# Patient Record
Sex: Male | Born: 1965 | Race: Black or African American | Hispanic: No | Marital: Married | Smoking: Current some day smoker
Health system: Southern US, Community
[De-identification: ages and names within clinical notes are randomized; demographics above are authoritative.]

## PROBLEM LIST (undated history)

## (undated) DIAGNOSIS — I10 Essential (primary) hypertension: Secondary | ICD-10-CM

## (undated) DIAGNOSIS — R011 Cardiac murmur, unspecified: Secondary | ICD-10-CM

## (undated) DIAGNOSIS — B009 Herpesviral infection, unspecified: Secondary | ICD-10-CM

## (undated) DIAGNOSIS — E7800 Pure hypercholesterolemia, unspecified: Secondary | ICD-10-CM

## (undated) HISTORY — PX: EYE SURGERY: SHX253

## (undated) HISTORY — PX: LUMBAR EPIDURAL INJECTION: SHX1980

---

## 2013-02-04 ENCOUNTER — Emergency Department (HOSPITAL_COMMUNITY)
Admission: EM | Admit: 2013-02-04 | Discharge: 2013-02-05 | Disposition: A | Discharge status: Home / Self Care | Payer: BC Managed Care – PPO | Attending: Emergency Medicine | Admitting: Emergency Medicine

## 2013-02-04 ENCOUNTER — Encounter (HOSPITAL_COMMUNITY): Payer: Self-pay | Admitting: *Deleted

## 2013-02-04 DIAGNOSIS — R142 Eructation: Secondary | ICD-10-CM | POA: Insufficient documentation

## 2013-02-04 DIAGNOSIS — R143 Flatulence: Secondary | ICD-10-CM | POA: Insufficient documentation

## 2013-02-04 DIAGNOSIS — Z79899 Other long term (current) drug therapy: Secondary | ICD-10-CM | POA: Insufficient documentation

## 2013-02-04 DIAGNOSIS — N289 Disorder of kidney and ureter, unspecified: Secondary | ICD-10-CM

## 2013-02-04 DIAGNOSIS — R109 Unspecified abdominal pain: Secondary | ICD-10-CM | POA: Insufficient documentation

## 2013-02-04 DIAGNOSIS — E7800 Pure hypercholesterolemia, unspecified: Secondary | ICD-10-CM | POA: Insufficient documentation

## 2013-02-04 DIAGNOSIS — R141 Gas pain: Secondary | ICD-10-CM | POA: Insufficient documentation

## 2013-02-04 DIAGNOSIS — Z7982 Long term (current) use of aspirin: Secondary | ICD-10-CM | POA: Insufficient documentation

## 2013-02-04 DIAGNOSIS — I10 Essential (primary) hypertension: Secondary | ICD-10-CM | POA: Insufficient documentation

## 2013-02-04 HISTORY — DX: Essential (primary) hypertension: I10

## 2013-02-04 HISTORY — DX: Pure hypercholesterolemia, unspecified: E78.00

## 2013-02-04 NOTE — ED Notes (Signed)
Pt in c/o abd pain and bloating since this afternoon, states it started after he ate lunch today

## 2013-02-04 NOTE — ED Notes (Signed)
Pt states he ate lunch around 12:30 today (chicken, vegetables, and yams), about an hour after started having abdominal pain, stated "I felt like my stomach was in a ball". Pt states had 2 normal BM's after lunch today, denies nausea and vomiting, states has passed some gas, but states still having abdominal pain. States now the pain is radiating around to lower back.

## 2013-02-05 ENCOUNTER — Emergency Department (HOSPITAL_COMMUNITY): Payer: BC Managed Care – PPO

## 2013-02-05 LAB — COMPREHENSIVE METABOLIC PANEL
Albumin: 3.9 g/dL (ref 3.5–5.2)
Alkaline Phosphatase: 47 U/L (ref 39–117)
BUN: 25 mg/dL — ABNORMAL HIGH (ref 6–23)
Calcium: 9.3 mg/dL (ref 8.4–10.5)
Creatinine, Ser: 4.23 mg/dL — ABNORMAL HIGH (ref 0.50–1.35)
GFR calc Af Amer: 18 mL/min — ABNORMAL LOW (ref >90)
Glucose, Bld: 109 mg/dL — ABNORMAL HIGH (ref 70–99)
Total Protein: 7.2 g/dL (ref 6.0–8.3)

## 2013-02-05 LAB — URINALYSIS, ROUTINE W REFLEX MICROSCOPIC
Glucose, UA: NEGATIVE mg/dL
Ketones, ur: NEGATIVE mg/dL
Leukocytes, UA: NEGATIVE
Nitrite: NEGATIVE
Specific Gravity, Urine: 1.011 (ref 1.005–1.030)
pH: 5 (ref 5.0–8.0)

## 2013-02-05 LAB — CBC WITH DIFFERENTIAL/PLATELET
Basophils Relative: 0 % (ref 0–1)
Eosinophils Absolute: 0.1 10*3/uL (ref 0.0–0.7)
Eosinophils Relative: 1 % (ref 0–5)
Hemoglobin: 14.3 g/dL (ref 13.0–17.0)
Lymphs Abs: 1.4 10*3/uL (ref 0.7–4.0)
MCH: 30.3 pg (ref 26.0–34.0)
MCHC: 34.6 g/dL (ref 30.0–36.0)
MCV: 87.5 fL (ref 78.0–100.0)
Monocytes Absolute: 0.8 10*3/uL (ref 0.1–1.0)
Monocytes Relative: 8 % (ref 3–12)
Neutrophils Relative %: 76 % (ref 43–77)
RBC: 4.72 MIL/uL (ref 4.22–5.81)

## 2013-02-05 LAB — URINE MICROSCOPIC-ADD ON

## 2013-02-05 MED ORDER — HYDROCODONE-ACETAMINOPHEN 5-325 MG PO TABS: 1.0000 | ORAL_TABLET | Freq: Four times a day (QID) | ORAL | Status: DC | PRN

## 2013-02-05 MED ORDER — HYDROMORPHONE HCL PF 1 MG/ML IJ SOLN
1.0000 mg | Freq: Once | INTRAMUSCULAR | Status: AC
  Administered 2013-02-05: 1 mg via INTRAVENOUS
  Filled 2013-02-05: qty 1

## 2013-02-05 MED ORDER — SODIUM CHLORIDE 0.9 % IV SOLN
1000.0000 mL | Freq: Once | INTRAVENOUS | Status: AC
  Administered 2013-02-05: 1000 mL via INTRAVENOUS

## 2013-02-05 MED ORDER — SODIUM CHLORIDE 0.9 % IV SOLN
1000.0000 mL | INTRAVENOUS | Status: DC
  Administered 2013-02-05: 1000 mL via INTRAVENOUS

## 2013-02-05 MED ORDER — ONDANSETRON HCL 4 MG/2ML IJ SOLN
4.0000 mg | Freq: Once | INTRAMUSCULAR | Status: AC
Start: 2013-02-05 — End: 2013-02-05
  Administered 2013-02-05: 4 mg via INTRAVENOUS
  Filled 2013-02-05: qty 2

## 2013-02-05 NOTE — ED Provider Notes (Signed)
History     CSN: [id number]  Arrival date & time 02/04/13  2140   First MD Initiated Contact with Patient 02/05/13 0037      Chief Complaint  Patient presents with  . Abdominal Pain    (Consider location/radiation/quality/duration/timing/severity/associated sxs/prior treatment) HPI Comments: Patient states, that after he ate lunch today, he developed abdominal bloating, 2 bowel movements and bloating.  Has been persistent.  He denies nausea, vomiting, has not had any abdominal surgery.  Has not been any traumatic event, recently, the pain is now radiating to his back  Patient is a 47 y.o. male presenting with abdominal pain. The history is provided by the patient.  Abdominal Pain This is a new problem. The current episode started today. The problem occurs constantly. The problem has been gradually worsening. Associated symptoms include abdominal pain. Pertinent negatives include no coughing, fever, nausea, rash or vomiting. Nothing aggravates the symptoms. He has tried nothing for the symptoms. The treatment provided no relief.    Past Medical History  Diagnosis Date  . Hypertension   . High cholesterol     History reviewed. No pertinent past surgical history.  History reviewed. No pertinent family history.  History  Substance Use Topics  . Smoking status: Not on file  . Smokeless tobacco: Not on file  . Alcohol Use: Not on file      Review of Systems  Constitutional: Negative for fever.  Respiratory: Negative for cough.   Gastrointestinal: Positive for abdominal pain and abdominal distention. Negative for nausea, vomiting, diarrhea and constipation.  Genitourinary: Negative for dysuria and frequency.  Skin: Negative for rash and wound.  Neurological: Negative for dizziness.  All other systems reviewed and are negative.    Allergies  Review of patient's allergies indicates no known allergies.  Home Medications   Current Outpatient Rx  Name  Route  Sig   Dispense  Refill  . aspirin EC 81 MG tablet   Oral   Take 81 mg by mouth every Monday, Wednesday, and Friday.         Marland Kitchen atorvastatin (LIPITOR) 40 MG tablet   Oral   Take 40 mg by mouth daily.         Marland Kitchen losartan (COZAAR) 100 MG tablet   Oral   Take 100 mg by mouth daily.         . Multiple Vitamin (MULTIVITAMIN WITH MINERALS) TABS   Oral   Take 1 tablet by mouth daily.         . naproxen (NAPROSYN) 500 MG tablet   Oral   Take 500 mg by mouth 2 (two) times daily as needed (pain).         Marland Kitchen HYDROcodone-acetaminophen (NORCO/VICODIN) 5-325 MG per tablet   Oral   Take 1 tablet by mouth every 6 (six) hours as needed for pain.   12 tablet   0     BP 140/83  Pulse 53  Temp(Src) 97.8 F (36.6 C) (Oral)  Resp 18  Ht 5\' 9"  (1.753 m)  Wt 225 lb (102.059 kg)  BMI 33.21 kg/m2  SpO2 100%  Physical Exam  Vitals reviewed. Constitutional: He is oriented to person, place, and time. He appears well-developed and well-nourished. No distress.  HENT:  Head: Normocephalic and atraumatic.  Eyes: Pupils are equal, round, and reactive to light.  Neck: Normal range of motion.  Cardiovascular: Normal rate and regular rhythm.   Pulmonary/Chest: Effort normal.  Abdominal: Soft. He exhibits distension. There is no tenderness.  +  BS on the Left but decreased on the R   Musculoskeletal: Normal range of motion.  Neurological: He is alert and oriented to person, place, and time.  Skin: Skin is warm and dry.    ED Course  Procedures (including critical care time)  Labs Reviewed  COMPREHENSIVE METABOLIC PANEL - Abnormal; Notable for the following:    Glucose, Bld 109 (*)    BUN 25 (*)    Creatinine, Ser 4.23 (*)    AST 40 (*)    GFR calc non Af Amer 16 (*)    GFR calc Af Amer 18 (*)    All other components within normal limits  URINALYSIS, ROUTINE W REFLEX MICROSCOPIC - Abnormal; Notable for the following:    Hgb urine dipstick SMALL (*)    Protein, ur 30 (*)    All other  components within normal limits  CBC WITH DIFFERENTIAL  URINE MICROSCOPIC-ADD ON   US Renal  02/05/2013   *RADIOLOGY REPORT*  Clinical Data: Abdominal pain.  RENAL/URINARY TRACT ULTRASOUND COMPLETE  Comparison:  No priors.  Findings:  Right Kidney:  Parenchyma is slightly increased in echogenicity, which suggests underlying medical renal disease.  At the junction of the upper and interpolar region there is a 2.2 x 1.6 x 2.0 cm anechoic lesion with increased through transmission, compatible with a cyst.  No hydronephrosis.  10.9 cm in length.  Left Kidney:  Diffusely echogenic renal parenchyma, suggestive of medical renal disease.  No focal cystic or solid renal lesions.  No hydronephrosis.  11.8 cm in length.  Bladder:  Only mildly distended.  No focal wall abnormalities. Bilateral ureteral jets are noted.  IMPRESSION: 1.  Echogenic renal parenchyma bilaterally, suggestive of medical renal disease. 2.  2.2 x 1.6 x 2.0 cm cyst in the right kidney.   Original Report Authenticated By: Trudie Reed, M.D.   Dg Abd Acute W/chest  02/05/2013   *RADIOLOGY REPORT*  Clinical Data: Abdominal pain and nausea.  ACUTE ABDOMEN SERIES (ABDOMEN 2 VIEW & CHEST 1 VIEW)  Comparison: No priors.  Findings: Lung volumes are low.  No acute consolidative airspace disease.  No pleural effusions.  Pulmonary vasculature and the cardiomediastinal silhouette are within normal limits.  Gas and stool are noted throughout the colon extending to the level of the distal rectum.  No pathologic dilatation of small bowel. Several nondilated gas-filled loops of small bowel are noted in the central abdomen.  No pneumoperitoneum.  IMPRESSION: 1.  Nonspecific, nonobstructive bowel gas pattern. 2.  No pneumoperitoneum. 3.  Low lung volumes without radiographic evidence of acute cardiopulmonary disease.   Original Report Authenticated By: Trudie Reed, M.D.     1. Acute renal insufficiency   2. Abdominal pain       MDM   Discuss the  findings of the ultrasound with patient and his wife.  His lab values were reviewed.  They understand the importance of following up with his primary care physician today.  he's been cautioned about using nonsteroidal medications        Arman Filter, NP 02/05/13 [phone]

## 2013-02-06 NOTE — ED Provider Notes (Signed)
Medical screening examination/treatment/procedure(s) were performed by non-physician practitioner and as supervising physician I was immediately available for consultation/collaboration.  Courage Biglow, MD 02/06/13 0532 

## 2013-08-25 ENCOUNTER — Ambulatory Visit (HOSPITAL_COMMUNITY): Payer: BC Managed Care – PPO | Attending: Cardiology | Admitting: Radiology

## 2013-08-25 ENCOUNTER — Encounter: Payer: Self-pay | Admitting: Cardiology

## 2013-08-25 ENCOUNTER — Other Ambulatory Visit (HOSPITAL_COMMUNITY): Payer: Self-pay | Admitting: Family Medicine

## 2013-08-25 DIAGNOSIS — I059 Rheumatic mitral valve disease, unspecified: Secondary | ICD-10-CM

## 2013-08-25 DIAGNOSIS — I379 Nonrheumatic pulmonary valve disorder, unspecified: Secondary | ICD-10-CM | POA: Insufficient documentation

## 2013-08-25 DIAGNOSIS — I10 Essential (primary) hypertension: Secondary | ICD-10-CM | POA: Insufficient documentation

## 2013-08-25 DIAGNOSIS — E785 Hyperlipidemia, unspecified: Secondary | ICD-10-CM | POA: Insufficient documentation

## 2013-08-25 DIAGNOSIS — I359 Nonrheumatic aortic valve disorder, unspecified: Secondary | ICD-10-CM | POA: Insufficient documentation

## 2013-08-25 DIAGNOSIS — I351 Nonrheumatic aortic (valve) insufficiency: Secondary | ICD-10-CM

## 2013-08-25 NOTE — Progress Notes (Signed)
Echocardiogram performed.  

## 2013-12-14 ENCOUNTER — Encounter: Payer: Self-pay | Admitting: *Deleted

## 2014-07-20 ENCOUNTER — Ambulatory Visit (INDEPENDENT_AMBULATORY_CARE_PROVIDER_SITE_OTHER): Payer: BC Managed Care – PPO

## 2014-07-20 ENCOUNTER — Ambulatory Visit (INDEPENDENT_AMBULATORY_CARE_PROVIDER_SITE_OTHER): Payer: BC Managed Care – PPO | Admitting: Podiatry

## 2014-07-20 ENCOUNTER — Encounter: Payer: Self-pay | Admitting: Podiatry

## 2014-07-20 VITALS — BP 139/84 | HR 68 | Resp 16

## 2014-07-20 DIAGNOSIS — M79673 Pain in unspecified foot: Secondary | ICD-10-CM

## 2014-07-20 DIAGNOSIS — M2010 Hallux valgus (acquired), unspecified foot: Secondary | ICD-10-CM

## 2014-07-20 DIAGNOSIS — M898X9 Other specified disorders of bone, unspecified site: Secondary | ICD-10-CM

## 2014-07-20 DIAGNOSIS — L600 Ingrowing nail: Secondary | ICD-10-CM

## 2014-07-20 NOTE — Progress Notes (Signed)
   Subjective:    Patient ID: Greg HeritageReginald Ertl, male    DOB: 02/09/1966, 48 y.o.   MRN: [id number]  HPI Comments: "I have an ingrown"  Patient c/o tender 1st toe right, lateral border, for few weeks. He's been trimming down. No help.     Review of Systems  All other systems reviewed and are negative.      Objective:   Physical Exam        Assessment & Plan:

## 2014-07-20 NOTE — Patient Instructions (Addendum)
Betadine Soak Instructions  Purchase an 8 oz. bottle of BETADINE solution (Povidone)  THE DAY AFTER THE PROCEDURE  Place 1 tablespoon of betadine solution in a quart of warm tap water.  Submerge your foot or feet with outer bandage intact for the initial soak; this will allow the bandage to become moist and wet for easy lift off.  Once you remove your bandage, continue to soak in the solution for 20 minutes.  This soak should be done twice a day.  Next, remove your foot or feet from solution, blot dry the affected area and cover.  You may use a band aid large enough to cover the area or use gauze and tape.  Apply other medications to the area as directed by the doctor such as cortisporin otic solution (ear drops) or neosporin.  IF YOUR SKIN BECOMES IRRITATED WHILE USING THESE INSTRUCTIONS, IT IS OKAY TO SWITCH TO EPSOM SALTS AND WATER OR WHITE VINEGAR AND WATER.   Long Term Care Instructions-Post Nail Surgery  You have had your ingrown toenail and root treated with a chemical.  This chemical causes a burn that will drain and ooze like a blister.  This can drain for 6-8 weeks or longer.  It is important to keep this area clean, covered, and follow the soaking instructions dispensed at the time of your surgery.  This area will eventually dry and form a scab.  Once the scab forms you no longer need to soak or apply a dressing.  If at any time you experience an increase in pain, redness, swelling, or drainage, you should contact the office as soon as possible.    Bunion (Hallux Valgus) A bony bump (protrusion) on the inside of the foot, at the base of the first toe, is called a bunion (hallux valgus). A bunion causes the first toe to angle toward the other toes. SYMPTOMS   A bony bump on the inside of the foot, causing an outward turning of the first toe. It may also overlap the second toe.  Thickening of the skin (callus) over the bony bump.  Fluid buildup under the callus. Fluid may become  red, tender, and swollen (inflamed) with constant irritation or pressure.  Foot pain and stiffness. CAUSES  Many causes exist, including:  Inherited from your family (genetics).  Injury (trauma) forcing the first toe into a position in which it overlaps other toes.  Bunions are also associated with wearing shoes that have a narrow toe box (pointy shoes). RISK INCREASES WITH:  Family history of foot abnormalities, especially bunions.  Arthritis.  Narrow shoes, especially high heels. PREVENTION  Wear shoes with a wide toe box.  Avoid shoes with high heels.  Wear a small pad between the big toe and second toe.  Maintain proper conditioning:  Foot and ankle flexibility.  Muscle strength and endurance. PROGNOSIS  With proper treatment, bunions can typically be cured. Occasionally, surgery is required.  RELATED COMPLICATIONS   Infection of the bunion.  Arthritis of the first toe.  Risks of surgery, including infection, bleeding, injury to nerves (numb toe), recurrent bunion, overcorrection (toe points inward), arthritis of the big toe, big toe pointing upward, and bone not healing. TREATMENT  Treatment first consists of stopping the activities that aggravate the pain, taking pain medicines, and icing to reduce inflammation and pain. Wear shoes with a wide toe box. Shoes can be modified by a shoe repair person to relieve pressure on the bunion, especially if you cannot find shoes with a  wide enough toe box. You may also place a pad with the center cut out in your shoe, to reduce pressure on the bunion. Sometimes, an arch support (orthotic) may reduce pressure on the bunion and alleviate the symptoms. Stretching and strengthening exercises for the muscles of the foot may be useful. You may choose to wear a brace or pad at night to hold the big toe away from the second toe. If non-surgical treatments are not successful, surgery may be needed. Surgery involves removing the overgrown  tissue and correcting the position of the first toe, by realigning the bones. Bunion surgery is typically performed on an outpatient basis, meaning you can go home the same day as surgery. The surgery may involve cutting the mid portion of the bone of the first toe, or just cutting and repairing (reconstructing) the ligaments and soft tissues around the first toe.  MEDICATION   If pain medicine is needed, nonsteroidal anti-inflammatory medicines, such as aspirin and ibuprofen, or other minor pain relievers, such as acetaminophen, are often recommended.  Do not take pain medicine for 7 days before surgery.  Prescription pain relievers are usually only prescribed after surgery. Use only as directed and only as much as you need.  Ointments applied to the skin may be helpful. HEAT AND COLD  Cold treatment (icing) relieves pain and reduces inflammation. Cold treatment should be applied for 10 to 15 minutes every 2 to 3 hours for inflammation and pain and immediately after any activity that aggravates your symptoms. Use ice packs or an ice massage.  Heat treatment may be used prior to performing the stretching and strengthening activities prescribed by your caregiver, physical therapist, or athletic trainer. Use a heat pack or a warm soak. SEEK MEDICAL CARE IF:   Symptoms get worse or do not improve in 2 weeks, despite treatment.  After surgery, you develop fever, increasing pain, redness, swelling, drainage of fluids, bleeding, or increasing warmth around the surgical area.  New, unexplained symptoms develop. (Drugs used in treatment may produce side effects.) Document Released: 08/21/2005 Document Revised: 11/13/2011 Document Reviewed: 12/03/2008 [facility] Patient Information 2015 [place], [place]. This information is not intended to replace advice given to you by your health care provider. Make sure you discuss any questions you have with your health care provider.

## 2014-07-20 NOTE — Progress Notes (Signed)
Subjective:     Patient ID: Greg Dillon, male   DOB: 12/13/1965, 48 y.o.   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 States it's been hurting for at least 5 weeks and getting gradually worse over time   Review of Systems  All other systems reviewed and are negative.      Objective:   Physical Exam  Constitutional: He is oriented to person, place, and time.  Cardiovascular: Intact distal pulses.   Musculoskeletal: Normal range of motion.  Neurological: He is oriented to person, place, and time.  Skin: Skin is warm.  Nursing note and vitals reviewed. neurovascular status intact with muscle strength adequate and range of motion subtalar and midtarsal joint within normal limits. Patient is noted to have significant for foot structural malalignment with large hyperostosis medial aspect first metatarsal head and deviation of the hallux against second toe with irritated area and incurvated nail border right hallux lateral border that's painful when pressed and also some hypertrophic tissue secondary to the pressure of the big toe against the second toe. 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 This would require a nonweightbearing procedure to fix and at this time her to focus on the nail and see if we can get him better. Today I went ahead and discussed nail correction the fact it may recur and ultimately he may require surgery of his structural deformity but work and affixed at this way and I  explained to him risk. Patient wants surgery and today I infiltrated 60 mg Xylocaine Marcaine mixture removed the lateral border exposed matrix and applied phenol 3 applications 30 seconds followed by alcohol lavaged and sterile dressing. Gave instructions on soaks and reappoint to discuss further deformity

## 2014-07-22 ENCOUNTER — Ambulatory Visit (INDEPENDENT_AMBULATORY_CARE_PROVIDER_SITE_OTHER): Payer: BC Managed Care – PPO | Admitting: Podiatry

## 2014-07-22 ENCOUNTER — Telehealth: Payer: Self-pay | Admitting: *Deleted

## 2014-07-22 DIAGNOSIS — L03031 Cellulitis of right toe: Secondary | ICD-10-CM

## 2014-07-22 NOTE — Telephone Encounter (Signed)
Pt states he had a toenail procedure on 07/20/2014, and today it is very swollen.  I called the pt and he stated he called again, was instructed to come to the office.

## 2014-07-23 NOTE — Progress Notes (Signed)
Subjective:     Patient ID: Greg HeritageReginald Whitaker, male   DOB: 12/21/1965, 48 y.o.   MRN: [id number]  HPI patient states just wanted to get my toe checked   Review of Systems     Objective:   Physical Exam Neurovascular status intact with well-healing nail site with no indications of disease    Assessment:     Well healing paronychia site    Plan:     Instructed on physical therapy and padding and if symptoms were to persist or drainage were to start patient's to reappoint

## 2014-08-19 ENCOUNTER — Telehealth: Payer: Self-pay | Admitting: *Deleted

## 2014-08-19 NOTE — Telephone Encounter (Signed)
Patient called stating that he had ingrown procedure done 1 month ago and is still having discomfort and numbness.

## 2014-08-26 ENCOUNTER — Other Ambulatory Visit (HOSPITAL_COMMUNITY): Payer: Self-pay | Admitting: Family Medicine

## 2014-08-26 ENCOUNTER — Encounter: Payer: Self-pay | Admitting: Podiatry

## 2014-08-26 ENCOUNTER — Ambulatory Visit (HOSPITAL_COMMUNITY): Payer: BC Managed Care – PPO | Attending: Family Medicine | Admitting: Radiology

## 2014-08-26 ENCOUNTER — Ambulatory Visit (INDEPENDENT_AMBULATORY_CARE_PROVIDER_SITE_OTHER): Payer: BC Managed Care – PPO | Admitting: Podiatry

## 2014-08-26 ENCOUNTER — Ambulatory Visit (INDEPENDENT_AMBULATORY_CARE_PROVIDER_SITE_OTHER): Payer: BC Managed Care – PPO

## 2014-08-26 VITALS — BP 139/84 | HR 68 | Resp 16

## 2014-08-26 DIAGNOSIS — M79676 Pain in unspecified toe(s): Secondary | ICD-10-CM

## 2014-08-26 DIAGNOSIS — M2010 Hallux valgus (acquired), unspecified foot: Secondary | ICD-10-CM

## 2014-08-26 DIAGNOSIS — I351 Nonrheumatic aortic (valve) insufficiency: Secondary | ICD-10-CM

## 2014-08-26 NOTE — Progress Notes (Signed)
Subjective:     Patient ID: Greg Dillon, male   DOB: 10/08/1965, 48 y.o.   MRN: [id number]  HPI patient states I'm having some trouble with the right big toe I just wanted to make sure it was okay   Review of Systems     Objective:   Physical Exam Approximate 4 weeks after having ingrown toenail removal right lateral border with a postop probable irritation from Betadine creating blister with good healing occurring now crust on the lateral surface and mild localized drainage with no proximal edema erythema or drainage noted    Assessment:     Mild localized paronychia infection with no apparent proximal spread    Plan:     As a precautionary measure I did x-ray to rule out any bony issues and I then evaluated and explained the utilization of soaks dressings and that it should heal uneventfully. If anything should occur or other symptoms she is to let us know he also complained about mild numbness in the toe and I do think as the blister has healed this will gradually get better over time but he will come to us if that gives him any issue

## 2014-08-26 NOTE — Progress Notes (Signed)
Echocardiogram performed.  

## 2015-05-19 ENCOUNTER — Encounter (HOSPITAL_COMMUNITY): Payer: Self-pay | Admitting: Emergency Medicine

## 2015-05-19 ENCOUNTER — Emergency Department (HOSPITAL_COMMUNITY)
Admission: EM | Admit: 2015-05-19 | Discharge: 2015-05-19 | Disposition: A | Discharge status: Home / Self Care | Payer: BLUE CROSS/BLUE SHIELD | Attending: Emergency Medicine | Admitting: Emergency Medicine

## 2015-05-19 ENCOUNTER — Emergency Department (HOSPITAL_COMMUNITY): Payer: BLUE CROSS/BLUE SHIELD

## 2015-05-19 DIAGNOSIS — Z7982 Long term (current) use of aspirin: Secondary | ICD-10-CM | POA: Insufficient documentation

## 2015-05-19 DIAGNOSIS — Z79899 Other long term (current) drug therapy: Secondary | ICD-10-CM | POA: Diagnosis not present

## 2015-05-19 DIAGNOSIS — R011 Cardiac murmur, unspecified: Secondary | ICD-10-CM | POA: Insufficient documentation

## 2015-05-19 DIAGNOSIS — E780 Pure hypercholesterolemia: Secondary | ICD-10-CM | POA: Insufficient documentation

## 2015-05-19 DIAGNOSIS — R079 Chest pain, unspecified: Secondary | ICD-10-CM | POA: Insufficient documentation

## 2015-05-19 DIAGNOSIS — I10 Essential (primary) hypertension: Secondary | ICD-10-CM | POA: Insufficient documentation

## 2015-05-19 HISTORY — DX: Cardiac murmur, unspecified: R01.1

## 2015-05-19 LAB — BASIC METABOLIC PANEL
ANION GAP: 7 (ref 5–15)
BUN: 12 mg/dL (ref 6–20)
CALCIUM: 9.2 mg/dL (ref 8.9–10.3)
CHLORIDE: 105 mmol/L (ref 101–111)
CO2: 28 mmol/L (ref 22–32)
Creatinine, Ser: 1.33 mg/dL — ABNORMAL HIGH (ref 0.61–1.24)
GFR calc Af Amer: >60 mL/min (ref >60)
GFR calc non Af Amer: >60 mL/min (ref >60)
GLUCOSE: 133 mg/dL — AB (ref 65–99)
Potassium: 3.8 mmol/L (ref 3.5–5.1)
Sodium: 140 mmol/L (ref 135–145)

## 2015-05-19 LAB — CBC
HCT: 44.3 % (ref 39.0–52.0)
HEMOGLOBIN: 15.1 g/dL (ref 13.0–17.0)
MCH: 30.6 pg (ref 26.0–34.0)
MCHC: 34.1 g/dL (ref 30.0–36.0)
MCV: 89.7 fL (ref 78.0–100.0)
Platelets: 235 10*3/uL (ref 150–400)
RBC: 4.94 MIL/uL (ref 4.22–5.81)
RDW: 12.9 % (ref 11.5–15.5)
WBC: 4.7 10*3/uL (ref 4.0–10.5)

## 2015-05-19 LAB — I-STAT TROPONIN, ED
TROPONIN I, POC: 0 ng/mL (ref 0.00–0.08)
Troponin i, poc: 0 ng/mL (ref 0.00–0.08)

## 2015-05-19 MED ORDER — ASPIRIN 81 MG PO CHEW
324.0000 mg | CHEWABLE_TABLET | Freq: Once | ORAL | Status: AC
  Administered 2015-05-19: 324 mg via ORAL
  Filled 2015-05-19: qty 4

## 2015-05-19 NOTE — ED Notes (Signed)
Pt returned from xray

## 2015-05-19 NOTE — ED Provider Notes (Signed)
CSN: [id number]     Arrival date & time 05/19/15  0704 History   First MD Initiated Contact with Patient 05/19/15 [phone]     Chief Complaint  Patient presents with  . Chest Pain     (Consider location/radiation/quality/duration/timing/severity/associated sxs/prior Treatment) HPI Comments: 49 year old male with a history of hypertension, hyperlipidemia who presents with chest pain. The patient states that 2-3 days ago, he had a gradual onset of intermittent, mild, left-sided chest pain that starts in his left breast and radiates to his left axilla. The pain was mild yesterday but since he is woken up this morning today, the pain has been constant rather than intermittent which is what prompted him to come in today. He denies any associated shortness of breath, nausea/vomiting, or diaphoresis. The pain is not associated with activity. The chest pain does not worsen with movement or laying flat. He ate dinner last night and felt like the chest pain mildly improved after dinner. He denies any feelings of heartburn. No recent cough/cold symptoms, fevers, vomiting, diarrhea. No recent travel. No leg pain or swelling.  Family history negative for cardiac disease or blood clots.  Patient is a 49 y.o. male presenting with chest pain. The history is provided by the patient.  Chest Pain   Past Medical History  Diagnosis Date  . Hypertension   . High cholesterol   . Heart murmur    History reviewed. No pertinent past surgical history. Family History  Problem Relation Age of Onset  . Hypertension Mother   . Hyperlipidemia Father   . Diabetes Mellitus I Father   . Hypertension Sister   . Seizures Brother    Social History  Substance Use Topics  . Smoking status: Never Smoker   . Smokeless tobacco: None  . Alcohol Use: No    Review of Systems  Cardiovascular: Positive for chest pain.   10 Systems reviewed and are negative for acute change except as noted in the HPI.    Allergies   Losartan and Povidone iodine  Home Medications   Prior to Admission medications   Medication Sig Start Date End Date Taking? Authorizing Provider  amLODipine (NORVASC) 10 MG tablet Take 10 mg by mouth daily.   Yes Historical Provider, MD  atorvastatin (LIPITOR) 40 MG tablet Take 40 mg by mouth daily.   Yes Historical Provider, MD  aspirin EC 81 MG tablet Take 81 mg by mouth every Monday, Wednesday, and Friday.    Historical Provider, MD  Multiple Vitamin (MULTIVITAMIN WITH MINERALS) TABS Take 1 tablet by mouth daily.    Historical Provider, MD   BP 117/69 mmHg  Pulse 52  Temp(Src) 97.7 F (36.5 C) (Oral)  Resp 15  Ht  (1.753 m)  Wt 220 lb (99.791 kg)  BMI 32.47 kg/m2  SpO2 96% Physical Exam  Constitutional: He is oriented to person, place, and time. He appears well-developed and well-nourished. No distress.  HENT:  Head: Normocephalic and atraumatic.  Moist mucous membranes  Eyes: Conjunctivae are normal. Pupils are equal, round, and reactive to light.  Neck: Neck supple.  Cardiovascular: Normal rate, regular rhythm and normal heart sounds.   No murmur heard. Pulmonary/Chest: Effort normal and breath sounds normal.  Abdominal: Soft. Bowel sounds are normal. He exhibits no distension. There is no tenderness.  Musculoskeletal: He exhibits no edema.  Neurological: He is alert and oriented to person, place, and time.  Fluent speech  Skin: Skin is warm and dry.  Psychiatric: He has a normal mood  and affect. Judgment normal.  pleasant  Nursing note and vitals reviewed.   ED Course  Procedures (including critical care time) Labs Review Labs Reviewed  BASIC METABOLIC PANEL - Abnormal; Notable for the following:    Glucose, Bld 133 (*)    Creatinine, Ser 1.33 (*)    All other components within normal limits  CBC  I-STAT TROPOININ, ED  Rosezena Sensor, ED    Imaging Review Dg Chest 2 View  05/19/2015   CLINICAL DATA:  Left-sided chest pain  EXAM: CHEST  2 VIEW   COMPARISON:  February 05, 2013  FINDINGS: Lungs are clear. Heart size and pulmonary vascularity are normal. No adenopathy. No pneumothorax. No bone lesions.  IMPRESSION: No abnormality noted.   Electronically Signed   By: Bretta Bang III M.D.   On: 05/19/2015 07:45   I have personally reviewed and evaluated these images and lab results as part of my medical decision-making.   EKG Interpretation   Date/Time:  Wednesday May 19 2015 07:14:39 EDT Ventricular Rate:  65 PR Interval:  161 QRS Duration: 88 QT Interval:  404 QTC Calculation: 420 R Axis:   -31 Text Interpretation:  Sinus rhythm Abnormal R-wave progression, late  transition Left ventricular hypertrophy abnormal R wave through precordial  leads Confirmed by LITTLE MD, RACHEL ([id number]) on 05/19/2015 7:32:29 AM     Medications  aspirin chewable tablet 324 mg (324 mg Oral Given 05/19/15 0742)    MDM   Final diagnoses:  Chest pain, unspecified chest pain type   48yo M w/ HTN and HLD who p/w a few days of chest pain without any associated symptoms. Pt well appearing with reassuring VS at presentation. EKG without acute ischemic changes. Obtained labs including serial troponins as well as CXR. Gave pt 324mg  ASA.  The patient has no risk factors for PE and is PERC negative thus I feel PE is very unlikely. No sudden ripping/tearing CP and no widened mediastinum to suggest aortic dissection. Labs show creatinine of 1.33 but otherwise unremarkable including negative serial troponins. Based on patient's reassuring labs and EKG, I feel that ACS is very unlikely. Patient's HEART score is <3 given age and no smoking, no FH of heart disease. I feel that he is safe for discharge home with close follow-up. The patient already has established care with a cardiologist and I have encouraged him to schedule a follow-up appointment at the cardiology clinic. Return precautions reviewed and patient voiced understanding. Patient discharged in  satisfactory condition.    Laurence Spates, MD 05/19/15 [phone]

## 2015-05-19 NOTE — ED Notes (Signed)
Patient brought back to room; patient undressed, in gown, on monitor, continuous pulse oximetry and blood pressure cuff 

## 2015-05-19 NOTE — ED Notes (Signed)
Patient with chest pain that started almost 3 days ago.  Patient states that it is his left breast area and left axilla.  No shortness of breath.  Patient states that the pain increased throughout the day yesterday.  Patient does have high cholesterol and HTN and heart murmur and was worried.  He states that he felt the pain when he first woke up this morning.

## 2015-05-19 NOTE — Discharge Instructions (Signed)

## 2015-08-18 ENCOUNTER — Telehealth: Payer: Self-pay

## 2015-08-18 NOTE — Telephone Encounter (Signed)
NOTES ON FILE 

## 2015-09-03 ENCOUNTER — Ambulatory Visit (HOSPITAL_COMMUNITY): Payer: BLUE CROSS/BLUE SHIELD

## 2015-09-09 ENCOUNTER — Other Ambulatory Visit: Payer: Self-pay | Admitting: Family Medicine

## 2015-09-09 ENCOUNTER — Other Ambulatory Visit: Payer: Self-pay

## 2015-09-09 ENCOUNTER — Ambulatory Visit (HOSPITAL_COMMUNITY): Payer: BLUE CROSS/BLUE SHIELD | Attending: Cardiovascular Disease

## 2015-09-09 DIAGNOSIS — I10 Essential (primary) hypertension: Secondary | ICD-10-CM | POA: Insufficient documentation

## 2015-09-09 DIAGNOSIS — R011 Cardiac murmur, unspecified: Secondary | ICD-10-CM | POA: Insufficient documentation

## 2015-09-09 DIAGNOSIS — E785 Hyperlipidemia, unspecified: Secondary | ICD-10-CM | POA: Insufficient documentation

## 2015-09-09 DIAGNOSIS — I351 Nonrheumatic aortic (valve) insufficiency: Secondary | ICD-10-CM | POA: Diagnosis not present

## 2016-01-19 ENCOUNTER — Emergency Department (HOSPITAL_COMMUNITY)
Admission: EM | Admit: 2016-01-19 | Discharge: 2016-01-19 | Disposition: A | Discharge status: Home / Self Care | Payer: BLUE CROSS/BLUE SHIELD | Attending: Emergency Medicine | Admitting: Emergency Medicine

## 2016-01-19 ENCOUNTER — Emergency Department (HOSPITAL_COMMUNITY): Payer: BLUE CROSS/BLUE SHIELD

## 2016-01-19 ENCOUNTER — Encounter (HOSPITAL_COMMUNITY): Payer: Self-pay | Admitting: *Deleted

## 2016-01-19 DIAGNOSIS — S3992XA Unspecified injury of lower back, initial encounter: Secondary | ICD-10-CM | POA: Diagnosis not present

## 2016-01-19 DIAGNOSIS — Y999 Unspecified external cause status: Secondary | ICD-10-CM | POA: Diagnosis not present

## 2016-01-19 DIAGNOSIS — Y92096 Garden or yard of other non-institutional residence as the place of occurrence of the external cause: Secondary | ICD-10-CM | POA: Diagnosis not present

## 2016-01-19 DIAGNOSIS — M545 Low back pain, unspecified: Secondary | ICD-10-CM

## 2016-01-19 DIAGNOSIS — R52 Pain, unspecified: Secondary | ICD-10-CM | POA: Diagnosis not present

## 2016-01-19 DIAGNOSIS — Y93H2 Activity, gardening and landscaping: Secondary | ICD-10-CM | POA: Insufficient documentation

## 2016-01-19 DIAGNOSIS — W1830XA Fall on same level, unspecified, initial encounter: Secondary | ICD-10-CM | POA: Insufficient documentation

## 2016-01-19 DIAGNOSIS — M542 Cervicalgia: Secondary | ICD-10-CM | POA: Diagnosis not present

## 2016-01-19 DIAGNOSIS — I10 Essential (primary) hypertension: Secondary | ICD-10-CM | POA: Diagnosis not present

## 2016-01-19 DIAGNOSIS — G8929 Other chronic pain: Secondary | ICD-10-CM | POA: Diagnosis not present

## 2016-01-19 DIAGNOSIS — E7800 Pure hypercholesterolemia, unspecified: Secondary | ICD-10-CM | POA: Insufficient documentation

## 2016-01-19 DIAGNOSIS — Z79899 Other long term (current) drug therapy: Secondary | ICD-10-CM | POA: Diagnosis not present

## 2016-01-19 MED ORDER — HYDROMORPHONE HCL 1 MG/ML IJ SOLN
1.0000 mg | Freq: Once | INTRAMUSCULAR | Status: AC
  Administered 2016-01-19: 1 mg via INTRAMUSCULAR

## 2016-01-19 MED ORDER — HYDROMORPHONE HCL 1 MG/ML IJ SOLN
1.0000 mg | Freq: Once | INTRAMUSCULAR | Status: DC
  Filled 2016-01-19: qty 1

## 2016-01-19 MED ORDER — OXYCODONE-ACETAMINOPHEN 5-325 MG PO TABS: 2.0000 | ORAL_TABLET | ORAL | Status: DC | PRN

## 2016-01-19 MED ORDER — DIAZEPAM 5 MG PO TABS: 5.0000 mg | ORAL_TABLET | Freq: Two times a day (BID) | ORAL | Status: DC

## 2016-01-19 MED ORDER — KETOROLAC TROMETHAMINE 60 MG/2ML IM SOLN
60.0000 mg | Freq: Once | INTRAMUSCULAR | Status: AC
  Administered 2016-01-19: 60 mg via INTRAMUSCULAR
  Filled 2016-01-19: qty 2

## 2016-01-19 MED ORDER — DIAZEPAM 5 MG PO TABS
5.0000 mg | ORAL_TABLET | Freq: Once | ORAL | Status: AC
  Administered 2016-01-19: 5 mg via ORAL
  Filled 2016-01-19: qty 1

## 2016-01-19 MED ORDER — IBUPROFEN 200 MG PO TABS: 600.0000 mg | ORAL_TABLET | Freq: Once | ORAL | Status: DC

## 2016-01-19 MED ORDER — OXYCODONE-ACETAMINOPHEN 5-325 MG PO TABS: 1.0000 | ORAL_TABLET | Freq: Once | ORAL | Status: DC

## 2016-01-19 NOTE — ED Provider Notes (Signed)
CSN: [id number]     Arrival date & time 01/19/16  0857 History   First MD Initiated Contact with Patient 01/19/16 0914     Chief Complaint  Patient presents with  . Back Pain   HPI Comments: 50 year old male who presents with low back pain s/p fall earlier this morning. PMH significant for recurrent low back pain and spasms. He states he was doing yard work and felt his back start to spasm. He fell to the ground and reports immediate onset of pain. Denies fever, LOC, head injury, loss of bowel/bladder function, urinary retention, inability to walk or weakness in his legs, numbness/tingling. EMS was called who gave him of Fentanyl and Zofran en route. He has a history of back pain and has been evaluated by ortho in the past. 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 He has had steroid injections in his back but had never required surgery.  Patient is a 50 y.o. male presenting with back pain.  Back Pain Associated symptoms: no fever     Past Medical History  Diagnosis Date  . Hypertension   . High cholesterol   . Heart murmur    Past Surgical History  Procedure Laterality Date  . Lumbar epidural injection     Family History  Problem Relation Age of Onset  . Hypertension Mother   . Hyperlipidemia Father   . Diabetes Mellitus I Father   . Hypertension Sister   . Seizures Brother    Social History  Substance Use Topics  . Smoking status: Never Smoker   . Smokeless tobacco: None  . Alcohol Use: No    Review of Systems  Constitutional: Negative for fever.  Musculoskeletal: Positive for back pain and gait problem.  Neurological: Negative for syncope.  All other systems reviewed and are negative.     Allergies  Losartan and Povidone iodine  Home Medications   Prior to Admission medications   Medication Sig Start Date End Date Taking? Authorizing Provider  amLODipine (NORVASC) 10 MG tablet Take 10 mg by mouth  daily.   Yes Historical Provider, MD  atorvastatin (LIPITOR) 40 MG tablet Take 40 mg by mouth daily.   Yes Historical Provider, MD   BP 148/107 mmHg  Pulse 73  Temp(Src) 98.5 F (36.9 C) (Oral)  Resp 17  Ht  (1.753 m)  Wt 100.699 kg  BMI 32.77 kg/m2  SpO2 100% Physical Exam  Constitutional: He is oriented to person, place, and time. He appears well-developed and well-nourished. No distress.  HENT:  Head: Normocephalic and atraumatic.  Eyes: Conjunctivae are normal. Pupils are equal, round, and reactive to light. Right eye exhibits no discharge. Left eye exhibits no discharge. No scleral icterus.  Neck: Normal range of motion.  Pulmonary/Chest: Effort normal. No respiratory distress.  Abdominal: Soft. There is no tenderness.  Musculoskeletal:  Inspection: No masses, deformity Palpation: Tenderness to palpation of L spine in to sacrum and left lumbar paraspinal muscles. No point tenderness. No tenderness of C-spine, T-spine. ROM: Deferred - patient refused Gait: Antalgic gait Reflexes: Patellar reflex is 2+ bilaterally, Achilles is 2+ bilaterally SLR: Positive seated straight leg raise bilaterally. R side to 45 degrees and L side to 15 degrees   Neurological: He is alert and oriented to person, place, and time.  Skin: Skin is warm and dry.  Psychiatric: He has a normal mood and affect.    ED Course  Procedures (  including critical care time) Labs Review Labs Reviewed - No data to display  Imaging Review Dg Lumbar Spine Complete  01/19/2016  CLINICAL DATA:  Bent over and felt today. Low back injury pain. Initial encounter. EXAM: LUMBAR SPINE - COMPLETE 4+ VIEW COMPARISON:  None. FINDINGS: There is no evidence of lumbar spine fracture. Alignment is normal. Intervertebral disc spaces are maintained. No facet arthropathy or other bone lesions identified. IMPRESSION: Negative lumbar spine radiographs. Electronically Signed   By: Myles RosenthalJohn  Stahl M.D.   On: 01/19/2016 11:56   I have  personally reviewed and evaluated these images and lab results as part of my medical decision-making.   EKG Interpretation None     Meds given in ED:  Medications  diazepam (VALIUM) tablet 5 mg (5 mg Oral Given 01/19/16 1046)  ketorolac (TORADOL) injection 60 mg (60 mg Intramuscular Given 01/19/16 1043)  HYDROmorphone (DILAUDID) injection 1 mg (1 mg Intramuscular Given 01/19/16 1047)    Discharge Medication List as of 01/19/2016 12:16 PM    START taking these medications   Details  diazepam (VALIUM) 5 MG tablet Take 1 tablet (5 mg total) by mouth 2 (two) times daily., Starting 01/19/2016, Until Discontinued, Print    oxyCODONE-acetaminophen (PERCOCET/ROXICET) 5-325 MG tablet Take 2 tablets by mouth every 4 (four) hours as needed for severe pain., Starting 01/19/2016, Until Discontinued, Print         MDM   Final diagnoses:  Midline low back pain without sciatica   50 year old male presents with acute on chronic low back pain. He is hypertensive on arrival however this has improved over the course of his ED stay. All other vitals are WNL and stable. Since he did have a fall an xray of the lumbar spine was obtained which was negative. Discussed with patient and wife that no further imaging is indicated at this time. Patient shows some improvement with administration of medicines here in the ED. Recommend ortho follow up for possible repeat MRI as an outpatient. Patient is NAD, non-toxic, with stable VS. Patient is informed of clinical course, understands medical decision making process, and agrees with plan. Opportunity for questions provided and all questions answered.Return precautions given.    Bethel BornKelly Marie Jahlani Lorentz, PA-C 01/20/16 1644  Melene Planan Floyd, DO 01/21/16 [phone]

## 2016-01-19 NOTE — ED Notes (Signed)
Pt presents via [facility] from home c/o lower back pain.  Pt was doing yard work and was leaning over when the pain caused him to fall to the ground, denies hitting head or LOC.  Hx: back pain, epidural 2015.  Pain in lower back.  Pt received fentanyl by EMS.  BP-160/90.   EMS reports pt ambulatory with assistance.  A x 4, NAD.

## 2016-01-19 NOTE — ED Notes (Signed)
Patient transported to X-ray 

## 2016-01-20 DIAGNOSIS — M545 Low back pain: Secondary | ICD-10-CM | POA: Diagnosis not present

## 2016-02-15 DIAGNOSIS — Z0000 Encounter for general adult medical examination without abnormal findings: Secondary | ICD-10-CM | POA: Diagnosis not present

## 2016-02-15 DIAGNOSIS — A6000 Herpesviral infection of urogenital system, unspecified: Secondary | ICD-10-CM | POA: Diagnosis not present

## 2016-02-15 DIAGNOSIS — E119 Type 2 diabetes mellitus without complications: Secondary | ICD-10-CM | POA: Diagnosis not present

## 2016-02-15 DIAGNOSIS — E782 Mixed hyperlipidemia: Secondary | ICD-10-CM | POA: Diagnosis not present

## 2016-02-15 DIAGNOSIS — Z23 Encounter for immunization: Secondary | ICD-10-CM | POA: Diagnosis not present

## 2016-02-15 DIAGNOSIS — Z125 Encounter for screening for malignant neoplasm of prostate: Secondary | ICD-10-CM | POA: Diagnosis not present

## 2016-02-15 DIAGNOSIS — I10 Essential (primary) hypertension: Secondary | ICD-10-CM | POA: Diagnosis not present

## 2016-03-09 DIAGNOSIS — G478 Other sleep disorders: Secondary | ICD-10-CM | POA: Diagnosis not present

## 2016-03-09 DIAGNOSIS — I10 Essential (primary) hypertension: Secondary | ICD-10-CM | POA: Diagnosis not present

## 2016-05-31 DIAGNOSIS — G4700 Insomnia, unspecified: Secondary | ICD-10-CM | POA: Diagnosis not present

## 2016-05-31 DIAGNOSIS — G4752 REM sleep behavior disorder: Secondary | ICD-10-CM | POA: Diagnosis not present

## 2016-06-14 DIAGNOSIS — G4752 REM sleep behavior disorder: Secondary | ICD-10-CM | POA: Diagnosis not present

## 2016-06-19 DIAGNOSIS — R002 Palpitations: Secondary | ICD-10-CM | POA: Diagnosis not present

## 2016-06-30 DIAGNOSIS — G4753 Recurrent isolated sleep paralysis: Secondary | ICD-10-CM | POA: Diagnosis not present

## 2016-08-14 DIAGNOSIS — A6000 Herpesviral infection of urogenital system, unspecified: Secondary | ICD-10-CM | POA: Diagnosis not present

## 2016-08-14 DIAGNOSIS — Z23 Encounter for immunization: Secondary | ICD-10-CM | POA: Diagnosis not present

## 2016-08-14 DIAGNOSIS — I10 Essential (primary) hypertension: Secondary | ICD-10-CM | POA: Diagnosis not present

## 2016-08-14 DIAGNOSIS — E119 Type 2 diabetes mellitus without complications: Secondary | ICD-10-CM | POA: Diagnosis not present

## 2016-08-14 DIAGNOSIS — E782 Mixed hyperlipidemia: Secondary | ICD-10-CM | POA: Diagnosis not present

## 2016-11-06 DIAGNOSIS — I359 Nonrheumatic aortic valve disorder, unspecified: Secondary | ICD-10-CM | POA: Diagnosis not present

## 2016-11-20 ENCOUNTER — Other Ambulatory Visit: Payer: Self-pay | Admitting: Family Medicine

## 2016-11-20 DIAGNOSIS — R1011 Right upper quadrant pain: Secondary | ICD-10-CM

## 2016-11-21 DIAGNOSIS — I351 Nonrheumatic aortic (valve) insufficiency: Secondary | ICD-10-CM | POA: Diagnosis not present

## 2016-11-24 DIAGNOSIS — I351 Nonrheumatic aortic (valve) insufficiency: Secondary | ICD-10-CM | POA: Diagnosis not present

## 2016-11-27 ENCOUNTER — Ambulatory Visit
Admission: RE | Admit: 2016-11-27 | Discharge: 2016-11-27 | Disposition: A | Discharge status: Home / Self Care | Payer: BLUE CROSS/BLUE SHIELD | Source: Ambulatory Visit | Attending: Family Medicine | Admitting: Family Medicine

## 2016-11-27 DIAGNOSIS — R1011 Right upper quadrant pain: Secondary | ICD-10-CM

## 2017-01-31 ENCOUNTER — Encounter (HOSPITAL_COMMUNITY): Payer: Self-pay

## 2017-01-31 ENCOUNTER — Emergency Department (HOSPITAL_COMMUNITY)
Admission: EM | Admit: 2017-01-31 | Discharge: 2017-02-01 | Disposition: A | Discharge status: Home / Self Care | Payer: BLUE CROSS/BLUE SHIELD | Attending: Emergency Medicine | Admitting: Emergency Medicine

## 2017-01-31 DIAGNOSIS — I10 Essential (primary) hypertension: Secondary | ICD-10-CM | POA: Diagnosis not present

## 2017-01-31 DIAGNOSIS — Z5321 Procedure and treatment not carried out due to patient leaving prior to being seen by health care provider: Secondary | ICD-10-CM | POA: Diagnosis not present

## 2017-01-31 DIAGNOSIS — Z79899 Other long term (current) drug therapy: Secondary | ICD-10-CM | POA: Diagnosis not present

## 2017-01-31 DIAGNOSIS — R109 Unspecified abdominal pain: Secondary | ICD-10-CM | POA: Insufficient documentation

## 2017-01-31 HISTORY — DX: Herpesviral infection, unspecified: B00.9

## 2017-01-31 LAB — URINALYSIS, ROUTINE W REFLEX MICROSCOPIC
BILIRUBIN URINE: NEGATIVE
GLUCOSE, UA: NEGATIVE mg/dL
Ketones, ur: NEGATIVE mg/dL
NITRITE: NEGATIVE
Protein, ur: 30 mg/dL — AB
SPECIFIC GRAVITY, URINE: 1.008 (ref 1.005–1.030)
pH: 5 (ref 5.0–8.0)

## 2017-01-31 NOTE — ED Triage Notes (Signed)
States fever off and on since last night and right flank pain states urinating more frequently no n/v voiced states now he is being treated for herpes with valtrex.

## 2017-02-01 DIAGNOSIS — I10 Essential (primary) hypertension: Secondary | ICD-10-CM | POA: Diagnosis not present

## 2017-02-01 DIAGNOSIS — Z7982 Long term (current) use of aspirin: Secondary | ICD-10-CM | POA: Diagnosis not present

## 2017-02-01 DIAGNOSIS — N390 Urinary tract infection, site not specified: Secondary | ICD-10-CM | POA: Diagnosis not present

## 2017-02-01 DIAGNOSIS — Z79899 Other long term (current) drug therapy: Secondary | ICD-10-CM | POA: Diagnosis not present

## 2017-02-01 DIAGNOSIS — N200 Calculus of kidney: Secondary | ICD-10-CM | POA: Diagnosis not present

## 2017-02-01 DIAGNOSIS — E785 Hyperlipidemia, unspecified: Secondary | ICD-10-CM | POA: Diagnosis not present

## 2017-02-01 DIAGNOSIS — F17290 Nicotine dependence, other tobacco product, uncomplicated: Secondary | ICD-10-CM | POA: Diagnosis not present

## 2017-02-01 NOTE — ED Notes (Signed)
[facility] and left.

## 2017-02-15 DIAGNOSIS — R0982 Postnasal drip: Secondary | ICD-10-CM | POA: Diagnosis not present

## 2017-02-15 DIAGNOSIS — M546 Pain in thoracic spine: Secondary | ICD-10-CM | POA: Diagnosis not present

## 2017-02-27 DIAGNOSIS — E782 Mixed hyperlipidemia: Secondary | ICD-10-CM | POA: Diagnosis not present

## 2017-02-27 DIAGNOSIS — I10 Essential (primary) hypertension: Secondary | ICD-10-CM | POA: Diagnosis not present

## 2017-02-27 DIAGNOSIS — A6000 Herpesviral infection of urogenital system, unspecified: Secondary | ICD-10-CM | POA: Diagnosis not present

## 2017-02-27 DIAGNOSIS — E119 Type 2 diabetes mellitus without complications: Secondary | ICD-10-CM | POA: Diagnosis not present

## 2017-08-31 DIAGNOSIS — H25013 Cortical age-related cataract, bilateral: Secondary | ICD-10-CM | POA: Diagnosis not present

## 2017-08-31 DIAGNOSIS — H524 Presbyopia: Secondary | ICD-10-CM | POA: Diagnosis not present

## 2017-10-01 DIAGNOSIS — Z125 Encounter for screening for malignant neoplasm of prostate: Secondary | ICD-10-CM | POA: Diagnosis not present

## 2017-10-01 DIAGNOSIS — I10 Essential (primary) hypertension: Secondary | ICD-10-CM | POA: Diagnosis not present

## 2017-10-01 DIAGNOSIS — Z0000 Encounter for general adult medical examination without abnormal findings: Secondary | ICD-10-CM | POA: Diagnosis not present

## 2017-10-01 DIAGNOSIS — Z23 Encounter for immunization: Secondary | ICD-10-CM | POA: Diagnosis not present

## 2017-10-01 DIAGNOSIS — E119 Type 2 diabetes mellitus without complications: Secondary | ICD-10-CM | POA: Diagnosis not present

## 2017-10-01 DIAGNOSIS — E782 Mixed hyperlipidemia: Secondary | ICD-10-CM | POA: Diagnosis not present

## 2017-10-01 DIAGNOSIS — A6000 Herpesviral infection of urogenital system, unspecified: Secondary | ICD-10-CM | POA: Diagnosis not present

## 2017-10-01 DIAGNOSIS — Z1211 Encounter for screening for malignant neoplasm of colon: Secondary | ICD-10-CM | POA: Diagnosis not present

## 2017-10-05 DIAGNOSIS — Z23 Encounter for immunization: Secondary | ICD-10-CM | POA: Diagnosis not present

## 2017-12-17 DIAGNOSIS — Z8371 Family history of colonic polyps: Secondary | ICD-10-CM | POA: Diagnosis not present

## 2017-12-17 DIAGNOSIS — K640 First degree hemorrhoids: Secondary | ICD-10-CM | POA: Diagnosis not present

## 2017-12-17 DIAGNOSIS — Z1211 Encounter for screening for malignant neoplasm of colon: Secondary | ICD-10-CM | POA: Diagnosis not present

## 2018-01-08 DIAGNOSIS — I351 Nonrheumatic aortic (valve) insufficiency: Secondary | ICD-10-CM | POA: Diagnosis not present

## 2018-01-08 DIAGNOSIS — Z8679 Personal history of other diseases of the circulatory system: Secondary | ICD-10-CM | POA: Diagnosis not present

## 2018-04-18 DIAGNOSIS — I10 Essential (primary) hypertension: Secondary | ICD-10-CM | POA: Diagnosis not present

## 2018-04-18 DIAGNOSIS — E782 Mixed hyperlipidemia: Secondary | ICD-10-CM | POA: Diagnosis not present

## 2018-04-18 DIAGNOSIS — R61 Generalized hyperhidrosis: Secondary | ICD-10-CM | POA: Diagnosis not present

## 2018-04-18 DIAGNOSIS — E119 Type 2 diabetes mellitus without complications: Secondary | ICD-10-CM | POA: Diagnosis not present

## 2018-05-07 DIAGNOSIS — I351 Nonrheumatic aortic (valve) insufficiency: Secondary | ICD-10-CM | POA: Diagnosis not present

## 2018-05-10 DIAGNOSIS — Z23 Encounter for immunization: Secondary | ICD-10-CM | POA: Diagnosis not present

## 2018-05-10 DIAGNOSIS — A6000 Herpesviral infection of urogenital system, unspecified: Secondary | ICD-10-CM | POA: Diagnosis not present

## 2018-05-10 DIAGNOSIS — I10 Essential (primary) hypertension: Secondary | ICD-10-CM | POA: Diagnosis not present

## 2018-05-10 DIAGNOSIS — R945 Abnormal results of liver function studies: Secondary | ICD-10-CM | POA: Diagnosis not present

## 2018-05-10 DIAGNOSIS — D72819 Decreased white blood cell count, unspecified: Secondary | ICD-10-CM | POA: Diagnosis not present

## 2018-05-10 DIAGNOSIS — E782 Mixed hyperlipidemia: Secondary | ICD-10-CM | POA: Diagnosis not present

## 2018-05-10 DIAGNOSIS — E1169 Type 2 diabetes mellitus with other specified complication: Secondary | ICD-10-CM | POA: Diagnosis not present

## 2018-06-20 DIAGNOSIS — H6691 Otitis media, unspecified, right ear: Secondary | ICD-10-CM | POA: Diagnosis not present

## 2018-09-03 DIAGNOSIS — M25562 Pain in left knee: Secondary | ICD-10-CM | POA: Diagnosis not present

## 2018-09-03 DIAGNOSIS — M25561 Pain in right knee: Secondary | ICD-10-CM | POA: Diagnosis not present

## 2018-09-06 DIAGNOSIS — H524 Presbyopia: Secondary | ICD-10-CM | POA: Diagnosis not present

## 2018-09-06 DIAGNOSIS — H04212 Epiphora due to excess lacrimation, left lacrimal gland: Secondary | ICD-10-CM | POA: Diagnosis not present

## 2018-09-06 DIAGNOSIS — H25013 Cortical age-related cataract, bilateral: Secondary | ICD-10-CM | POA: Diagnosis not present

## 2018-09-21 DIAGNOSIS — R21 Rash and other nonspecific skin eruption: Secondary | ICD-10-CM | POA: Diagnosis not present

## 2018-10-18 DIAGNOSIS — L309 Dermatitis, unspecified: Secondary | ICD-10-CM | POA: Diagnosis not present

## 2018-11-15 DIAGNOSIS — B078 Other viral warts: Secondary | ICD-10-CM | POA: Diagnosis not present

## 2018-11-15 DIAGNOSIS — L918 Other hypertrophic disorders of the skin: Secondary | ICD-10-CM | POA: Diagnosis not present

## 2019-01-09 DIAGNOSIS — I10 Essential (primary) hypertension: Secondary | ICD-10-CM | POA: Diagnosis not present

## 2019-01-09 DIAGNOSIS — R61 Generalized hyperhidrosis: Secondary | ICD-10-CM | POA: Diagnosis not present

## 2019-01-09 DIAGNOSIS — Z209 Contact with and (suspected) exposure to unspecified communicable disease: Secondary | ICD-10-CM | POA: Diagnosis not present

## 2019-02-13 DIAGNOSIS — E1169 Type 2 diabetes mellitus with other specified complication: Secondary | ICD-10-CM | POA: Diagnosis not present

## 2019-02-13 DIAGNOSIS — Z0000 Encounter for general adult medical examination without abnormal findings: Secondary | ICD-10-CM | POA: Diagnosis not present

## 2019-02-13 DIAGNOSIS — I10 Essential (primary) hypertension: Secondary | ICD-10-CM | POA: Diagnosis not present

## 2019-02-13 DIAGNOSIS — Z125 Encounter for screening for malignant neoplasm of prostate: Secondary | ICD-10-CM | POA: Diagnosis not present

## 2019-02-13 DIAGNOSIS — E782 Mixed hyperlipidemia: Secondary | ICD-10-CM | POA: Diagnosis not present

## 2019-02-13 DIAGNOSIS — I351 Nonrheumatic aortic (valve) insufficiency: Secondary | ICD-10-CM | POA: Diagnosis not present

## 2019-06-06 DIAGNOSIS — A6000 Herpesviral infection of urogenital system, unspecified: Secondary | ICD-10-CM | POA: Diagnosis not present

## 2019-06-06 DIAGNOSIS — E782 Mixed hyperlipidemia: Secondary | ICD-10-CM | POA: Diagnosis not present

## 2019-06-06 DIAGNOSIS — I10 Essential (primary) hypertension: Secondary | ICD-10-CM | POA: Diagnosis not present

## 2019-06-06 DIAGNOSIS — E1169 Type 2 diabetes mellitus with other specified complication: Secondary | ICD-10-CM | POA: Diagnosis not present

## 2019-08-14 DIAGNOSIS — M25561 Pain in right knee: Secondary | ICD-10-CM | POA: Diagnosis not present

## 2019-08-14 DIAGNOSIS — M25562 Pain in left knee: Secondary | ICD-10-CM | POA: Diagnosis not present

## 2019-09-26 DIAGNOSIS — M2241 Chondromalacia patellae, right knee: Secondary | ICD-10-CM | POA: Diagnosis not present

## 2019-09-26 DIAGNOSIS — M2242 Chondromalacia patellae, left knee: Secondary | ICD-10-CM | POA: Diagnosis not present

## 2019-10-01 DIAGNOSIS — H5711 Ocular pain, right eye: Secondary | ICD-10-CM | POA: Diagnosis not present

## 2019-10-01 DIAGNOSIS — H40033 Anatomical narrow angle, bilateral: Secondary | ICD-10-CM | POA: Diagnosis not present

## 2019-10-14 DIAGNOSIS — H40033 Anatomical narrow angle, bilateral: Secondary | ICD-10-CM | POA: Diagnosis not present

## 2019-10-14 DIAGNOSIS — H5711 Ocular pain, right eye: Secondary | ICD-10-CM | POA: Diagnosis not present

## 2019-10-21 DIAGNOSIS — H40031 Anatomical narrow angle, right eye: Secondary | ICD-10-CM | POA: Diagnosis not present

## 2019-10-28 DIAGNOSIS — M6248 Contracture of muscle, other site: Secondary | ICD-10-CM | POA: Diagnosis not present

## 2019-11-04 DIAGNOSIS — H40033 Anatomical narrow angle, bilateral: Secondary | ICD-10-CM | POA: Diagnosis not present

## 2019-11-04 DIAGNOSIS — H5711 Ocular pain, right eye: Secondary | ICD-10-CM | POA: Diagnosis not present

## 2019-11-24 ENCOUNTER — Ambulatory Visit: Payer: BLUE CROSS/BLUE SHIELD

## 2019-11-27 DIAGNOSIS — H40033 Anatomical narrow angle, bilateral: Secondary | ICD-10-CM | POA: Diagnosis not present

## 2019-11-27 DIAGNOSIS — H5711 Ocular pain, right eye: Secondary | ICD-10-CM | POA: Diagnosis not present

## 2019-12-26 DIAGNOSIS — H40033 Anatomical narrow angle, bilateral: Secondary | ICD-10-CM | POA: Diagnosis not present

## 2019-12-26 DIAGNOSIS — H5711 Ocular pain, right eye: Secondary | ICD-10-CM | POA: Diagnosis not present

## 2019-12-30 DIAGNOSIS — I10 Essential (primary) hypertension: Secondary | ICD-10-CM | POA: Diagnosis not present

## 2019-12-30 DIAGNOSIS — A6000 Herpesviral infection of urogenital system, unspecified: Secondary | ICD-10-CM | POA: Diagnosis not present

## 2019-12-30 DIAGNOSIS — E782 Mixed hyperlipidemia: Secondary | ICD-10-CM | POA: Diagnosis not present

## 2019-12-30 DIAGNOSIS — E1169 Type 2 diabetes mellitus with other specified complication: Secondary | ICD-10-CM | POA: Diagnosis not present

## 2020-01-16 DIAGNOSIS — M25561 Pain in right knee: Secondary | ICD-10-CM | POA: Diagnosis not present

## 2020-01-16 DIAGNOSIS — M25562 Pain in left knee: Secondary | ICD-10-CM | POA: Diagnosis not present

## 2020-01-16 DIAGNOSIS — M1710 Unilateral primary osteoarthritis, unspecified knee: Secondary | ICD-10-CM | POA: Diagnosis not present

## 2020-01-16 DIAGNOSIS — M170 Bilateral primary osteoarthritis of knee: Secondary | ICD-10-CM | POA: Diagnosis not present

## 2020-03-03 DIAGNOSIS — I361 Nonrheumatic tricuspid (valve) insufficiency: Secondary | ICD-10-CM | POA: Diagnosis not present

## 2020-03-03 DIAGNOSIS — I371 Nonrheumatic pulmonary valve insufficiency: Secondary | ICD-10-CM | POA: Diagnosis not present

## 2020-03-03 DIAGNOSIS — I351 Nonrheumatic aortic (valve) insufficiency: Secondary | ICD-10-CM | POA: Diagnosis not present

## 2020-04-02 DIAGNOSIS — M170 Bilateral primary osteoarthritis of knee: Secondary | ICD-10-CM | POA: Diagnosis not present

## 2020-04-09 DIAGNOSIS — M170 Bilateral primary osteoarthritis of knee: Secondary | ICD-10-CM | POA: Diagnosis not present

## 2020-04-16 DIAGNOSIS — M170 Bilateral primary osteoarthritis of knee: Secondary | ICD-10-CM | POA: Diagnosis not present

## 2020-04-30 DIAGNOSIS — M545 Low back pain: Secondary | ICD-10-CM | POA: Diagnosis not present

## 2020-04-30 DIAGNOSIS — E782 Mixed hyperlipidemia: Secondary | ICD-10-CM | POA: Diagnosis not present

## 2020-04-30 DIAGNOSIS — E1169 Type 2 diabetes mellitus with other specified complication: Secondary | ICD-10-CM | POA: Diagnosis not present

## 2020-04-30 DIAGNOSIS — I10 Essential (primary) hypertension: Secondary | ICD-10-CM | POA: Diagnosis not present

## 2020-07-02 DIAGNOSIS — H40033 Anatomical narrow angle, bilateral: Secondary | ICD-10-CM | POA: Diagnosis not present

## 2020-07-02 DIAGNOSIS — H5711 Ocular pain, right eye: Secondary | ICD-10-CM | POA: Diagnosis not present

## 2020-08-09 DIAGNOSIS — E782 Mixed hyperlipidemia: Secondary | ICD-10-CM | POA: Diagnosis not present

## 2020-08-09 DIAGNOSIS — Z125 Encounter for screening for malignant neoplasm of prostate: Secondary | ICD-10-CM | POA: Diagnosis not present

## 2020-08-09 DIAGNOSIS — Z0000 Encounter for general adult medical examination without abnormal findings: Secondary | ICD-10-CM | POA: Diagnosis not present

## 2020-08-09 DIAGNOSIS — Z23 Encounter for immunization: Secondary | ICD-10-CM | POA: Diagnosis not present

## 2020-08-09 DIAGNOSIS — E1169 Type 2 diabetes mellitus with other specified complication: Secondary | ICD-10-CM | POA: Diagnosis not present

## 2020-11-08 DIAGNOSIS — M79672 Pain in left foot: Secondary | ICD-10-CM | POA: Diagnosis not present

## 2020-12-20 DIAGNOSIS — H524 Presbyopia: Secondary | ICD-10-CM | POA: Diagnosis not present

## 2020-12-20 DIAGNOSIS — H40033 Anatomical narrow angle, bilateral: Secondary | ICD-10-CM | POA: Diagnosis not present

## 2020-12-20 DIAGNOSIS — H5711 Ocular pain, right eye: Secondary | ICD-10-CM | POA: Diagnosis not present

## 2021-02-07 DIAGNOSIS — I10 Essential (primary) hypertension: Secondary | ICD-10-CM | POA: Diagnosis not present

## 2021-02-07 DIAGNOSIS — E1169 Type 2 diabetes mellitus with other specified complication: Secondary | ICD-10-CM | POA: Diagnosis not present

## 2021-02-07 DIAGNOSIS — E782 Mixed hyperlipidemia: Secondary | ICD-10-CM | POA: Diagnosis not present

## 2021-02-07 DIAGNOSIS — A6000 Herpesviral infection of urogenital system, unspecified: Secondary | ICD-10-CM | POA: Diagnosis not present

## 2021-06-13 DIAGNOSIS — H5711 Ocular pain, right eye: Secondary | ICD-10-CM | POA: Diagnosis not present

## 2021-06-13 DIAGNOSIS — H40033 Anatomical narrow angle, bilateral: Secondary | ICD-10-CM | POA: Diagnosis not present

## 2021-06-13 DIAGNOSIS — E119 Type 2 diabetes mellitus without complications: Secondary | ICD-10-CM | POA: Diagnosis not present

## 2021-06-13 DIAGNOSIS — H25013 Cortical age-related cataract, bilateral: Secondary | ICD-10-CM | POA: Diagnosis not present

## 2021-06-13 DIAGNOSIS — H524 Presbyopia: Secondary | ICD-10-CM | POA: Diagnosis not present

## 2021-07-11 DIAGNOSIS — I351 Nonrheumatic aortic (valve) insufficiency: Secondary | ICD-10-CM | POA: Diagnosis not present

## 2021-07-11 DIAGNOSIS — H9220 Otorrhagia, unspecified ear: Secondary | ICD-10-CM | POA: Diagnosis not present

## 2021-07-11 DIAGNOSIS — F4310 Post-traumatic stress disorder, unspecified: Secondary | ICD-10-CM | POA: Diagnosis not present

## 2021-07-11 DIAGNOSIS — L309 Dermatitis, unspecified: Secondary | ICD-10-CM | POA: Diagnosis not present

## 2021-07-11 DIAGNOSIS — Z23 Encounter for immunization: Secondary | ICD-10-CM | POA: Diagnosis not present

## 2021-08-05 DIAGNOSIS — B078 Other viral warts: Secondary | ICD-10-CM | POA: Diagnosis not present

## 2021-08-12 DIAGNOSIS — Z1159 Encounter for screening for other viral diseases: Secondary | ICD-10-CM | POA: Diagnosis not present

## 2021-08-12 DIAGNOSIS — Z125 Encounter for screening for malignant neoplasm of prostate: Secondary | ICD-10-CM | POA: Diagnosis not present

## 2021-08-12 DIAGNOSIS — R413 Other amnesia: Secondary | ICD-10-CM | POA: Diagnosis not present

## 2021-08-12 DIAGNOSIS — M62838 Other muscle spasm: Secondary | ICD-10-CM | POA: Diagnosis not present

## 2021-08-12 DIAGNOSIS — I10 Essential (primary) hypertension: Secondary | ICD-10-CM | POA: Diagnosis not present

## 2021-08-12 DIAGNOSIS — Z0000 Encounter for general adult medical examination without abnormal findings: Secondary | ICD-10-CM | POA: Diagnosis not present

## 2021-08-12 DIAGNOSIS — E782 Mixed hyperlipidemia: Secondary | ICD-10-CM | POA: Diagnosis not present

## 2021-08-12 DIAGNOSIS — E118 Type 2 diabetes mellitus with unspecified complications: Secondary | ICD-10-CM | POA: Diagnosis not present

## 2021-11-09 ENCOUNTER — Encounter: Payer: Self-pay | Admitting: Physician Assistant

## 2021-11-09 ENCOUNTER — Ambulatory Visit: Payer: BC Managed Care – PPO | Admitting: Physician Assistant

## 2021-11-09 ENCOUNTER — Other Ambulatory Visit: Payer: Self-pay

## 2021-11-09 DIAGNOSIS — B078 Other viral warts: Secondary | ICD-10-CM | POA: Diagnosis not present

## 2021-11-09 DIAGNOSIS — R413 Other amnesia: Secondary | ICD-10-CM

## 2021-11-09 DIAGNOSIS — L538 Other specified erythematous conditions: Secondary | ICD-10-CM | POA: Diagnosis not present

## 2021-11-09 DIAGNOSIS — Z789 Other specified health status: Secondary | ICD-10-CM | POA: Diagnosis not present

## 2021-11-09 DIAGNOSIS — L298 Other pruritus: Secondary | ICD-10-CM | POA: Diagnosis not present

## 2021-11-09 NOTE — Patient Instructions (Addendum)
It was a pleasure to see you today at our office.   Recommendations:  MRI of the brain, the radiology office will call you to arrange you appointment Follow up pending on the results of the MRI brain Consider Psychotherapy for PTSD  Agree with sleep studies as they may affect your memory   RECOMMENDATIONS FOR ALL PATIENTS WITH MEMORY PROBLEMS: 1. Continue to exercise (Recommend 30 minutes of walking everyday, or 3 hours every week) 2. Increase social interactions - continue going to [place] and enjoy social gatherings with friends and family 3. Eat healthy, avoid fried foods and eat more fruits and vegetables 4. Maintain adequate blood pressure, blood sugar, and blood cholesterol level. Reducing the risk of stroke and cardiovascular disease also helps promoting better memory. 5. Avoid stressful situations. Live a simple life and avoid aggravations. Organize your time and prepare for the next day in anticipation. 6. Sleep well, avoid any interruptions of sleep and avoid any distractions in the bedroom that may interfere with adequate sleep quality 7. Avoid sugar, avoid sweets as there is a strong link between excessive sugar intake, diabetes, and cognitive impairment We discussed the Mediterranean diet, which has been shown to help patients reduce the risk of progressive memory disorders and reduces cardiovascular risk. This includes eating fish, eat fruits and green leafy vegetables, nuts like almonds and hazelnuts, walnuts, and also use olive oil. Avoid fast foods and fried foods as much as possible. Avoid sweets and sugar as sugar use has been linked to worsening of memory function.  There is always a concern of gradual progression of memory problems. If this is the case, then we may need to adjust level of care according to patient needs. Support, both to the patient and caregiver, should then be put into place.      You have been referred for a neuropsychological evaluation (i.e.,  evaluation of memory and thinking abilities). Please bring someone with you to this appointment if possible, as it is helpful for the doctor to hear from both you and another adult who knows you well. Please bring eyeglasses and hearing aids if you wear them.    The evaluation will take approximately 3 hours and has two parts:   The first part is a clinical interview with the neuropsychologist (Dr. Melvyn Novas or Dr. Nicole Kindred). During the interview, the neuropsychologist will speak with you and the individual you brought to the appointment.    The second part of the evaluation is testing with the doctor's technician Hinton Dyer or Maudie Mercury). During the testing, the technician will ask you to remember different types of material, solve problems, and answer some questionnaires. Your family member will not be present for this portion of the evaluation.   Please note: We must reserve several hours of the neuropsychologist's time and the psychometrician's time for your evaluation appointment. As such, there is a No-Show fee of $100. If you are unable to attend any of your appointments, please contact our office as soon as possible to reschedule.    FALL PRECAUTIONS: Be cautious when walking. Scan the area for obstacles that may increase the risk of trips and falls. When getting up in the mornings, sit up at the edge of the bed for a few minutes before getting out of bed. Consider elevating the bed at the head end to avoid drop of blood pressure when getting up. Walk always in a well-lit room (use night lights in the walls). Avoid area rugs or power cords from appliances in the  middle of the walkways. Use a walker or a cane if necessary and consider physical therapy for balance exercise. Get your eyesight checked regularly.  FINANCIAL OVERSIGHT: Supervision, especially oversight when making financial decisions or transactions is also recommended.  HOME SAFETY: Consider the safety of the kitchen when operating appliances like  stoves, microwave oven, and blender. Consider having supervision and share cooking responsibilities until no longer able to participate in those. Accidents with firearms and other hazards in the house should be identified and addressed as well.   ABILITY TO BE LEFT ALONE: If patient is unable to contact 911 operator, consider using LifeLine, or when the need is there, arrange for someone to stay with patients. Smoking is a fire hazard, consider supervision or cessation. Risk of wandering should be assessed by caregiver and if detected at any point, supervision and safe proof recommendations should be instituted.  MEDICATION SUPERVISION: Inability to self-administer medication needs to be constantly addressed. Implement a mechanism to ensure safe administration of the medications.   DRIVING: Regarding driving, in patients with progressive memory problems, driving will be impaired. We advise to have someone else do the driving if trouble finding directions or if minor accidents are reported. Independent driving assessment is available to determine safety of driving.   If you are interested in the driving assessment, you can contact the following:  [employer] in [place]  [place] [phone] or [phone]    [place] refers to food and lifestyle choices that are based on the traditions of countries located on the [employer]. This way of eating has been shown to help prevent certain conditions and improve outcomes for people who have chronic diseases, like kidney disease and heart disease. What are tips for following this plan? Lifestyle  Cook and eat meals together with your family, when possible. Drink enough fluid to keep your urine clear or pale yellow. Be physically active every day. This includes: Aerobic exercise like running  or swimming. Leisure activities like gardening, walking, or housework. Get 7-8 hours of sleep each night. If recommended by your health care provider, drink red wine in moderation. This means 1 glass a day for nonpregnant women and 2 glasses a day for men. A glass of wine equals 5 oz (150 mL). Reading food labels  Check the serving size of packaged foods. For foods such as rice and pasta, the serving size refers to the amount of cooked product, not dry. Check the total fat in packaged foods. Avoid foods that have saturated fat or trans fats. Check the ingredients list for added sugars, such as corn syrup. Shopping  At the grocery store, buy most of your food from the areas near the walls of the store. This includes: Fresh fruits and vegetables (produce). Grains, beans, nuts, and seeds. Some of these may be available in unpackaged forms or large amounts (in bulk). Fresh seafood. Poultry and eggs. Low-fat dairy products. Buy whole ingredients instead of prepackaged foods. Buy fresh fruits and vegetables in-season from local farmers markets. Buy frozen fruits and vegetables in resealable bags. If you do not have access to quality fresh seafood, buy precooked frozen shrimp or canned fish, such as tuna, salmon, or sardines. Buy small amounts of raw or cooked vegetables, salads, or olives from the deli or salad bar at your store. Stock your pantry so you always have certain foods on hand, such as olive oil, canned  tuna, canned tomatoes, rice, pasta, and beans. Cooking  Cook foods with extra-virgin olive oil instead of using butter or other vegetable oils. Have meat as a side dish, and have vegetables or grains as your main dish. This means having meat in small portions or adding small amounts of meat to foods like pasta or stew. Use beans or vegetables instead of meat in common dishes like chili or lasagna. Experiment with different cooking methods. Try roasting or broiling vegetables instead of  steaming or sauteing them. Add frozen vegetables to soups, stews, pasta, or rice. Add nuts or seeds for added healthy fat at each meal. You can add these to yogurt, salads, or vegetable dishes. Marinate fish or vegetables using olive oil, lemon juice, garlic, and fresh herbs. Meal planning  Plan to eat 1 vegetarian meal one day each week. Try to work up to 2 vegetarian meals, if possible. Eat seafood 2 or more times a week. Have healthy snacks readily available, such as: Vegetable sticks with hummus. Greek yogurt. Fruit and nut trail mix. Eat balanced meals throughout the week. This includes: Fruit: 2-3 servings a day Vegetables: 4-5 servings a day Low-fat dairy: 2 servings a day Fish, poultry, or lean meat: 1 serving a day Beans and legumes: 2 or more servings a week Nuts and seeds: 1-2 servings a day Whole grains: 6-8 servings a day Extra-virgin olive oil: 3-4 servings a day Limit red meat and sweets to only a few servings a month What are my food choices? Mediterranean diet Recommended Grains: Whole-grain pasta. Brown rice. Bulgar wheat. Polenta. Couscous. Whole-wheat bread. [place]. Vegetables: Artichokes. Beets. Broccoli. Cabbage. Carrots. Eggplant. Green beans. Chard. Kale. Spinach. Onions. Leeks. Peas. Squash. Tomatoes. Peppers. Radishes. Fruits: Apples. Apricots. Avocado. Berries. Bananas. Cherries. Dates. Figs. Grapes. Lemons. Melon. Oranges. Peaches. Plums. Pomegranate. Meats and other protein foods: Beans. Almonds. Sunflower seeds. Pine nuts. Peanuts. [place]. Salmon. Scallops. Shrimp. [place]. Tilapia. Clams. Oysters. Eggs. Dairy: Low-fat milk. Cheese. Greek yogurt. Beverages: Water. Red wine. Herbal tea. Fats and oils: Extra virgin olive oil. Avocado oil. Grape seed oil. Sweets and desserts: [place] yogurt with honey. Baked apples. Poached pears. Trail mix. Seasoning and other foods: Basil. Cilantro. Coriander. Cumin. Mint. Parsley. Sage. Rosemary. Tarragon. Garlic.  Oregano. Thyme. Pepper. Balsalmic vinegar. Tahini. Hummus. Tomato sauce. Olives. Mushrooms. Limit these Grains: Prepackaged pasta or rice dishes. Prepackaged cereal with added sugar. Vegetables: Deep fried potatoes (french fries). Fruits: Fruit canned in syrup. Meats and other protein foods: Beef. Pork. Lamb. Poultry with skin. Hot dogs. [place] Salines. Dairy: Ice cream. Sour cream. Whole milk. Beverages: Juice. Sugar-sweetened soft drinks. Beer. Liquor and spirits. Fats and oils: Butter. Canola oil. Vegetable oil. Beef fat (tallow). Lard. Sweets and desserts: Cookies. Cakes. Pies. Candy. Seasoning and other foods: Mayonnaise. Premade sauces and marinades. The items listed may not be a complete list. Talk with your dietitian about what dietary choices are right for you. Summary The Mediterranean diet includes both food and lifestyle choices. Eat a variety of fresh fruits and vegetables, beans, nuts, seeds, and whole grains. Limit the amount of red meat and sweets that you eat. Talk with your health care provider about whether it is safe for you to drink red wine in moderation. This means 1 glass a day for nonpregnant women and 2 glasses a day for men. A glass of wine equals 5 oz (150 mL). This information is not intended to replace advice given to you by your health care provider. Make sure you discuss any questions you have  with your health care provider. Document Released: 04/13/2016 Document Revised: 05/16/2016 Document Reviewed: 04/13/2016 Elsevier Interactive Patient Education  2017 [employer].   We have sent a referral to [facility] for your MRI and they will call you directly to schedule your appointment. They are located at [place]. If you need to contact them directly please call [phone].

## 2021-11-09 NOTE — Progress Notes (Addendum)
Assessment/Plan:   Greg Dillon is a very pleasant 56 y.o. year old RH male with  a history of hypertension, hyperlipidemia, insomnia, DM2, PTSD ([place] war veteran), history of sleep paralysis, seen today for evaluation of memory loss. MoCA today is 25/30, with delayed recall 2/5.  However, it is important to mention, that he excelled in most of his MoCA items, including language, where he produced 22 words in 1 minute starting with the letter F.  Patient reports that his main complaint is difficulty with multitasking, and "flashes of past events, including those during [employer] service in [place] ", suspicious of untreated PTSD.  He is not on any antidementia medications.    Recommendations:   Memory Difficulties  MRI brain with/without contrast to assess for underlying structural abnormality and assess vascular load  Check B12, TSH Discussed safety both in and out of the home.  Discussed the importance of regular daily schedule with inclusion of crossword puzzles to maintain brain function.  Continue to monitor mood with PCP.  Stay active at least 30 minutes at least 3 times a week.  Naps should be scheduled and should be no longer than 60 minutes and should not occur after 2 PM.  Control cardiovascular risk factors  Highly recommend that he be seen by psychiatry and behavioral therapy for the treatment of possible PTSD, which may be interfering with his cognitive status. Mediterranean diet is recommended  Folllow up is pending on the MRI results.  Subjective:    The patient is seen in neurologic consultation at the request of Aliene Beams, MD for the evaluation of memory.  The patient is accompanied by his wife on the phone who supplements the history. This is a 56 y.o. year old RH  male who has had memory issues since 2009, which was progressive.  His main complaint is that "I cannot take too many things at one time ".  Depending on the amount of information, he is unable to  remember the details.  He has never been diagnosed with ADD or ADHD.  "I am not a multitasker ".  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  He states that "if I do not write I will not retain ".  His wife adds that this happens frequently.  He denies repeating himself or disoriented when walking into the room.  Sometimes, "I have a memory of something, but I do not know where it came from, likely flash of memory ".  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  He never had psychotherapy.  He denies leaving objects in unusual places.  He ambulates without difficulty, denies any falls or head injuries.  In the past, he had severe migraines, but not recently.  He drives without difficulty, denies feeling lost.  He lives with his wife.  His wife reports that his mood is stable, he denies any depression or irritability.  He sleeps well, admits to very detailed dreams, mostly pleasant, occasionally with a nightmare.  His wife adds however, that all of them, he shows REM behavior, moving his mouth, hands, makes sounds, "saying gibberish things, sometimes that he is going to cry ".  Occasionally, she reports that he is in a paralytic state.  He is not aware of it. In addition, he reports that since the war, he only sits facing the window, not with his back from the window, and he is cautious when he goes to places, has a hard time trusting people.  He is not sure if there is paranoia. Denies hallucinations. He denies any hygiene concerns, in fact he showers 3 times a day.  He does not need assistance with bathing or dressing.  He takes his medications regularly without missing any doses.  He reports that he eats fast, and at times he may have trouble swallowing because he does not chew enough.   He does not cook very often, but denies leaving the stove or the faucet on.  He denies any dizziness.  In 2009, he had a benign positional vertigo, but none since then.  He denies double vision, focal numbness or tingling, unilateral weakness, tremors or anosmia, or seizures.  Denies urine incontinence, retention, constipation or diarrhea, OSA, alcohol or tobacco.  Family history grandmother with Alzheimer's disease.  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  He has a college degree  Pertinent labs:B12, normal 663 ,TSH 1.5 ,A1C 6.3   Support continuous improvement manage, 35-55 h a week . 6 members of his teams   Allergies  Allergen Reactions   Azithromycin    Losartan Other (See Comments)    Affected kidney functionn   Povidone Iodine Other (See Comments)    Blisters on toes   Tessalon [Benzonatate]     Current Outpatient Medications  Medication Instructions   amLODipine (NORVASC) 10 mg, Daily   aspirin 81 MG chewable tablet 1 tablet   atorvastatin (LIPITOR) 40 mg, Daily   cyclobenzaprine (FLEXERIL) 10 MG tablet 1 tablet as needed   diazepam (VALIUM) 5 mg, Oral, 2 times daily   oxyCODONE-acetaminophen (PERCOCET/ROXICET) 5-325 MG tablet 2 tablets, Oral, Every 4 hours PRN   valACYclovir (VALTREX) 500 mg, Oral, Daily     VITALS:   Vitals:   11/09/21 0755  BP: 136/82  Pulse: 71  Resp: 18  SpO2: 95%  Weight: 216 lb (98 kg)  Height: 5\' 9"  (1.753 m)   No flowsheet data found.  PHYSICAL EXAM   HEENT:  Normocephalic, atraumatic. The mucous membranes are moist. The superficial temporal arteries are without ropiness or tenderness. Cardiovascular: Regular rate and rhythm. Lungs: Clear to auscultation bilaterally. Neck: There are no carotid bruits noted bilaterally.  NEUROLOGICAL: Montreal Cognitive Assessment  11/09/2021  Visuospatial/ Executive (0/5) 4  Naming (0/3) 3   Attention: Read list of digits (0/2) 2  Attention: Read list of letters (0/1) 1  Attention: Serial 7 subtraction starting at 100 (0/3) 3  Language: Repeat phrase (0/2) 2  Language : Fluency (0/1) 1  Abstraction (0/2) 1  Delayed Recall (0/5) 2  Orientation (0/6) 6  Total 25   No flowsheet data found.  No flowsheet data found.   Orientation:  Alert and oriented to person, place and time. No aphasia or dysarthria. Fund of knowledge is appropriate. Recent and remote memory intact.  Attention and concentration are normal.  Able to name objects and repeat phrases. Delayed recall 0/3 Cranial nerves: There is good facial symmetry. Extraocular muscles are intact and visual fields are full to confrontational testing. Speech is fluent and clear. Soft palate rises symmetrically and there is no tongue deviation. Hearing is intact to conversational tone. Tone: Tone  is good throughout. Sensation: Sensation is intact to light touch and pinprick throughout. Vibration is intact at the bilateral big toe.There is no extinction with double simultaneous stimulation. There is no sensory dermatomal level identified. Coordination: The patient has no difficulty with RAM's or FNF bilaterally. Normal finger to nose  Motor: Strength is 5/5 in the bilateral upper and lower extremities. There is no pronator drift. There are no fasciculations noted. DTR's: Deep tendon reflexes are 2/4 at the bilateral biceps, triceps, brachioradialis, patella and achilles.  Plantar responses are downgoing bilaterally. Gait and Station: The patient is able to ambulate without difficulty.The patient is able to heel toe walk without any difficulty.The patient is able to ambulate in a tandem fashion. The patient is able to stand in the Romberg position.     Thank you for allowing [place] the opportunity to participate in the care of this nice patient. Please do not hesitate to contact us for any questions or concerns.   Total time spent on  today's visit was 60 minutes, including both face-to-face time and nonface-to-face time.  Time included that spent on review of records (prior notes available to me/labs/imaging if pertinent), discussing treatment and goals, answering patient's questions and coordinating care.  Cc:  Tally Joe, MD  Marlowe Kays 11/10/2021 7:04 AM

## 2021-11-23 ENCOUNTER — Other Ambulatory Visit: Payer: BC Managed Care – PPO

## 2021-11-28 ENCOUNTER — Ambulatory Visit
Admission: RE | Admit: 2021-11-28 | Discharge: 2021-11-28 | Disposition: A | Discharge status: Home / Self Care | Payer: BC Managed Care – PPO | Source: Ambulatory Visit | Attending: Physician Assistant | Admitting: Physician Assistant

## 2021-11-28 ENCOUNTER — Other Ambulatory Visit: Payer: Self-pay

## 2021-11-28 DIAGNOSIS — J329 Chronic sinusitis, unspecified: Secondary | ICD-10-CM | POA: Diagnosis not present

## 2021-11-28 DIAGNOSIS — J341 Cyst and mucocele of nose and nasal sinus: Secondary | ICD-10-CM | POA: Diagnosis not present

## 2021-11-28 DIAGNOSIS — R413 Other amnesia: Secondary | ICD-10-CM | POA: Diagnosis not present

## 2021-11-28 MED ORDER — GADOBENATE DIMEGLUMINE 529 MG/ML IV SOLN
20.0000 mL | Freq: Once | INTRAVENOUS | Status: AC | PRN
  Administered 2021-11-28: 20 mL via INTRAVENOUS

## 2021-11-30 DIAGNOSIS — I359 Nonrheumatic aortic valve disorder, unspecified: Secondary | ICD-10-CM | POA: Diagnosis not present

## 2021-11-30 DIAGNOSIS — I351 Nonrheumatic aortic (valve) insufficiency: Secondary | ICD-10-CM | POA: Diagnosis not present

## 2021-12-02 DIAGNOSIS — J341 Cyst and mucocele of nose and nasal sinus: Secondary | ICD-10-CM | POA: Diagnosis not present

## 2021-12-02 DIAGNOSIS — G4700 Insomnia, unspecified: Secondary | ICD-10-CM | POA: Diagnosis not present

## 2021-12-02 DIAGNOSIS — Z23 Encounter for immunization: Secondary | ICD-10-CM | POA: Diagnosis not present

## 2021-12-02 DIAGNOSIS — R32 Unspecified urinary incontinence: Secondary | ICD-10-CM | POA: Diagnosis not present

## 2021-12-02 DIAGNOSIS — F419 Anxiety disorder, unspecified: Secondary | ICD-10-CM | POA: Diagnosis not present

## 2021-12-05 ENCOUNTER — Telehealth: Payer: Self-pay | Admitting: Physician Assistant

## 2021-12-05 NOTE — Telephone Encounter (Signed)
Pt called in and left a message with the access nurse. Wants to go over MRI results ?

## 2021-12-05 NOTE — Telephone Encounter (Signed)
Patient advised of MRI  

## 2021-12-09 DIAGNOSIS — Z0189 Encounter for other specified special examinations: Secondary | ICD-10-CM | POA: Diagnosis not present

## 2021-12-09 DIAGNOSIS — M170 Bilateral primary osteoarthritis of knee: Secondary | ICD-10-CM | POA: Diagnosis not present

## 2021-12-09 DIAGNOSIS — M25862 Other specified joint disorders, left knee: Secondary | ICD-10-CM | POA: Diagnosis not present

## 2021-12-09 DIAGNOSIS — M461 Sacroiliitis, not elsewhere classified: Secondary | ICD-10-CM | POA: Diagnosis not present

## 2021-12-12 DIAGNOSIS — M25561 Pain in right knee: Secondary | ICD-10-CM | POA: Diagnosis not present

## 2021-12-12 DIAGNOSIS — M25552 Pain in left hip: Secondary | ICD-10-CM | POA: Diagnosis not present

## 2021-12-12 DIAGNOSIS — M545 Low back pain, unspecified: Secondary | ICD-10-CM | POA: Diagnosis not present

## 2021-12-12 DIAGNOSIS — M25562 Pain in left knee: Secondary | ICD-10-CM | POA: Diagnosis not present

## 2021-12-14 DIAGNOSIS — M545 Low back pain, unspecified: Secondary | ICD-10-CM | POA: Diagnosis not present

## 2021-12-16 DIAGNOSIS — M5416 Radiculopathy, lumbar region: Secondary | ICD-10-CM | POA: Diagnosis not present

## 2021-12-22 DIAGNOSIS — B078 Other viral warts: Secondary | ICD-10-CM | POA: Diagnosis not present

## 2021-12-22 DIAGNOSIS — R208 Other disturbances of skin sensation: Secondary | ICD-10-CM | POA: Diagnosis not present

## 2021-12-22 DIAGNOSIS — R238 Other skin changes: Secondary | ICD-10-CM | POA: Diagnosis not present

## 2021-12-22 DIAGNOSIS — Z789 Other specified health status: Secondary | ICD-10-CM | POA: Diagnosis not present

## 2021-12-23 DIAGNOSIS — M545 Low back pain, unspecified: Secondary | ICD-10-CM | POA: Diagnosis not present

## 2022-01-19 DIAGNOSIS — M1712 Unilateral primary osteoarthritis, left knee: Secondary | ICD-10-CM | POA: Diagnosis not present

## 2022-01-19 DIAGNOSIS — M1711 Unilateral primary osteoarthritis, right knee: Secondary | ICD-10-CM | POA: Diagnosis not present

## 2022-01-19 DIAGNOSIS — M25562 Pain in left knee: Secondary | ICD-10-CM | POA: Diagnosis not present

## 2022-01-19 DIAGNOSIS — M170 Bilateral primary osteoarthritis of knee: Secondary | ICD-10-CM | POA: Diagnosis not present

## 2022-01-19 DIAGNOSIS — M25561 Pain in right knee: Secondary | ICD-10-CM | POA: Diagnosis not present

## 2022-01-19 DIAGNOSIS — B078 Other viral warts: Secondary | ICD-10-CM | POA: Diagnosis not present

## 2022-01-20 DIAGNOSIS — H40031 Anatomical narrow angle, right eye: Secondary | ICD-10-CM | POA: Diagnosis not present

## 2022-01-20 DIAGNOSIS — H40033 Anatomical narrow angle, bilateral: Secondary | ICD-10-CM | POA: Diagnosis not present

## 2022-01-23 DIAGNOSIS — D1809 Hemangioma of other sites: Secondary | ICD-10-CM | POA: Diagnosis not present

## 2022-01-23 DIAGNOSIS — M5416 Radiculopathy, lumbar region: Secondary | ICD-10-CM | POA: Diagnosis not present

## 2022-01-23 DIAGNOSIS — Z6832 Body mass index (BMI) 32.0-32.9, adult: Secondary | ICD-10-CM | POA: Diagnosis not present

## 2022-01-31 DIAGNOSIS — R0989 Other specified symptoms and signs involving the circulatory and respiratory systems: Secondary | ICD-10-CM | POA: Diagnosis not present

## 2022-02-16 DIAGNOSIS — B078 Other viral warts: Secondary | ICD-10-CM | POA: Diagnosis not present

## 2022-02-17 DIAGNOSIS — I082 Rheumatic disorders of both aortic and tricuspid valves: Secondary | ICD-10-CM | POA: Diagnosis not present

## 2022-02-17 DIAGNOSIS — I359 Nonrheumatic aortic valve disorder, unspecified: Secondary | ICD-10-CM | POA: Diagnosis not present

## 2022-02-17 DIAGNOSIS — I351 Nonrheumatic aortic (valve) insufficiency: Secondary | ICD-10-CM | POA: Diagnosis not present

## 2022-02-21 DIAGNOSIS — M25562 Pain in left knee: Secondary | ICD-10-CM | POA: Diagnosis not present

## 2022-02-21 DIAGNOSIS — M25561 Pain in right knee: Secondary | ICD-10-CM | POA: Diagnosis not present

## 2022-02-21 DIAGNOSIS — M5416 Radiculopathy, lumbar region: Secondary | ICD-10-CM | POA: Diagnosis not present

## 2022-02-28 DIAGNOSIS — M5416 Radiculopathy, lumbar region: Secondary | ICD-10-CM | POA: Diagnosis not present

## 2022-03-08 DIAGNOSIS — M5416 Radiculopathy, lumbar region: Secondary | ICD-10-CM | POA: Diagnosis not present

## 2022-03-09 DIAGNOSIS — M25552 Pain in left hip: Secondary | ICD-10-CM | POA: Diagnosis not present

## 2022-03-09 DIAGNOSIS — Z23 Encounter for immunization: Secondary | ICD-10-CM | POA: Diagnosis not present

## 2022-03-09 DIAGNOSIS — M25752 Osteophyte, left hip: Secondary | ICD-10-CM | POA: Diagnosis not present

## 2022-03-10 DIAGNOSIS — M5416 Radiculopathy, lumbar region: Secondary | ICD-10-CM | POA: Diagnosis not present

## 2022-03-10 DIAGNOSIS — H527 Unspecified disorder of refraction: Secondary | ICD-10-CM | POA: Diagnosis not present

## 2022-03-15 DIAGNOSIS — N3944 Nocturnal enuresis: Secondary | ICD-10-CM | POA: Diagnosis not present

## 2022-03-15 DIAGNOSIS — N3943 Post-void dribbling: Secondary | ICD-10-CM | POA: Diagnosis not present

## 2022-03-15 DIAGNOSIS — N401 Enlarged prostate with lower urinary tract symptoms: Secondary | ICD-10-CM | POA: Diagnosis not present

## 2022-03-27 DIAGNOSIS — B078 Other viral warts: Secondary | ICD-10-CM | POA: Diagnosis not present

## 2022-03-28 DIAGNOSIS — J309 Allergic rhinitis, unspecified: Secondary | ICD-10-CM | POA: Diagnosis not present

## 2022-03-28 DIAGNOSIS — X58XXXA Exposure to other specified factors, initial encounter: Secondary | ICD-10-CM | POA: Diagnosis not present

## 2022-03-28 DIAGNOSIS — D164 Benign neoplasm of bones of skull and face: Secondary | ICD-10-CM | POA: Diagnosis not present

## 2022-03-28 DIAGNOSIS — S00411A Abrasion of right ear, initial encounter: Secondary | ICD-10-CM | POA: Diagnosis not present

## 2022-03-30 DIAGNOSIS — N3944 Nocturnal enuresis: Secondary | ICD-10-CM | POA: Diagnosis not present

## 2022-03-30 DIAGNOSIS — N401 Enlarged prostate with lower urinary tract symptoms: Secondary | ICD-10-CM | POA: Diagnosis not present

## 2022-03-30 DIAGNOSIS — Z125 Encounter for screening for malignant neoplasm of prostate: Secondary | ICD-10-CM | POA: Diagnosis not present

## 2022-03-30 DIAGNOSIS — H9313 Tinnitus, bilateral: Secondary | ICD-10-CM | POA: Diagnosis not present

## 2022-03-30 DIAGNOSIS — H903 Sensorineural hearing loss, bilateral: Secondary | ICD-10-CM | POA: Diagnosis not present

## 2022-04-05 ENCOUNTER — Encounter: Payer: Self-pay | Admitting: Physician Assistant

## 2022-04-13 ENCOUNTER — Ambulatory Visit: Payer: BC Managed Care – PPO | Admitting: Physician Assistant

## 2022-04-17 DIAGNOSIS — S93105A Unspecified dislocation of left toe(s), initial encounter: Secondary | ICD-10-CM | POA: Diagnosis not present

## 2022-04-17 DIAGNOSIS — M2012 Hallux valgus (acquired), left foot: Secondary | ICD-10-CM | POA: Diagnosis not present

## 2022-04-17 DIAGNOSIS — D169 Benign neoplasm of bone and articular cartilage, unspecified: Secondary | ICD-10-CM | POA: Diagnosis not present

## 2022-04-17 DIAGNOSIS — M25775 Osteophyte, left foot: Secondary | ICD-10-CM | POA: Diagnosis not present

## 2022-04-19 ENCOUNTER — Ambulatory Visit: Payer: No Typology Code available for payment source | Admitting: Physician Assistant

## 2022-04-19 ENCOUNTER — Ambulatory Visit: Payer: BC Managed Care – PPO | Admitting: Physician Assistant

## 2022-04-20 ENCOUNTER — Ambulatory Visit (INDEPENDENT_AMBULATORY_CARE_PROVIDER_SITE_OTHER): Payer: No Typology Code available for payment source | Admitting: Physician Assistant

## 2022-04-20 ENCOUNTER — Encounter: Payer: Self-pay | Admitting: Physician Assistant

## 2022-04-20 VITALS — BP 130/81 | HR 58 | Ht 69.0 in | Wt 221.0 lb

## 2022-04-20 DIAGNOSIS — G8929 Other chronic pain: Secondary | ICD-10-CM | POA: Diagnosis not present

## 2022-04-20 DIAGNOSIS — R519 Headache, unspecified: Secondary | ICD-10-CM | POA: Diagnosis not present

## 2022-04-20 NOTE — Progress Notes (Signed)
Assessment/Plan:   Chronic Non Intractable Headaches    For preventative management, None  For abortive therapy, continue Sumatriptan 50 mg at the first onset of headaches, can take another dose 2 h prn, max 2 a day  as per PCP   Limit use of pain relievers to no more than 2 days out of week to prevent risk of rebound or medication-overuse headache.  Keep headache diary  Exercise, hydration, caffeine cessation, sleep hygiene, monitor for and avoid triggers  Consider:  magnesium citrate 400mg  daily, riboflavin 400mg  daily, and coenzyme Q10 100mg  three times daily Follow up 6 months     Subjective:   This patient is here alone  Previous records as well as any outside records available were reviewed prior to todays visit.     Onset: started at age [age], in College had some episodes and but were never treated with medicines.  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 Since then, when internalizing issues, his HA is worse.  Sometimes, it interferes with comprehension.  Of note, in the recent past the patient had been evaluated for memory issues, yielding completely normal evaluation and imaging. Quality: 8-10  Intensity: Sharp, like a knife  Location: bandlike in the frontal area and a burning sensation in the back of the head . Sometimes he feels "behind the eyeballs " Duration: 1 hour Frequency: 3 times a month  Associated symptoms: sometimes nausea, no vomiting.  Aura: Endorsed.  "some stars" minutes before the headache  Activity: washing the car or mowing the lawn may trigger it as well Aggravating factors: stress and deadlines can trigger it to Relieving factors: lies down, puts a towel on the forehead, light and sounds may make it worse . Working out is relaxing to him  Current abortive medications: Sumatriptan  50 mg at the first onset of HA, max 4 in a day.  Tylenol as needed Current prophylactic medications: None  Past  abortive medications:  BC powder 2-3 a week , Excedrin, Goody Powder . Past prophylactic medications:   Family history: Negative for migraines. Smoker: cigars  3-4 /y  Alcohol: not recently. Stopped in July    Caffeine:  12-24 oz a day . In the winter 36-48 oz coffee Sleep:  better since not working, he is on disability because he could not leave the meetings as before. No more than 6 hours a night.  MRI brain 11/2021 Unremarkable MRI appearance of the brain. No evidence of acute intracranial abnormality.  Past Medical History:  Diagnosis Date   Heart murmur    Herpes    High cholesterol    Hypertension      Past Surgical History:  Procedure Laterality Date   EYE SURGERY Right    LUMBAR EPIDURAL INJECTION       PREVIOUS MEDICATIONS:   CURRENT MEDICATIONS:  Outpatient Encounter Medications as of 04/20/2022  Medication Sig   amLODipine (NORVASC) 10 MG tablet Take 10 mg by mouth daily.   aspirin 81 MG chewable tablet 1 tablet   atorvastatin (LIPITOR) 40 MG tablet Take 40 mg by mouth daily.   busPIRone (BUSPAR) 10 MG tablet Take 10 mg by mouth 3 (three) times daily.   cholecalciferol (VITAMIN D3) 25 MCG (1000 UNIT) tablet Take 2,000 Units by mouth daily.   cyclobenzaprine (FLEXERIL) 10 MG tablet    diazepam (VALIUM) 5 MG tablet Take 1 tablet (5 mg total) by mouth 2 (two) times daily.  fluticasone (FLONASE) 50 MCG/ACT nasal spray Place 2 sprays into both nostrils daily.   hydrOXYzine (ATARAX) 10 MG tablet Take 10 mg by mouth 2 (two) times daily as needed.   ondansetron (ZOFRAN-ODT) 4 MG disintegrating tablet Take 4 mg by mouth every 8 (eight) hours as needed.   oxyCODONE-acetaminophen (PERCOCET/ROXICET) 5-325 MG tablet Take 2 tablets by mouth every 4 (four) hours as needed for severe pain.   valACYclovir (VALTREX) 500 MG tablet Take 500 mg by mouth daily.   No facility-administered encounter medications on file as of 04/20/2022.     Objective:     PHYSICAL EXAMINATION:     VITALS:   Vitals:   04/20/22 0748  BP: 130/81  Pulse: (!) 58  SpO2: 99%  Weight: 221 lb (100.2 kg)  Height: 5\' 9"  (1.753 m)    GEN:  The patient appears stated age and is in NAD. HEENT:  Normocephalic, atraumatic.   Neurological examination:  General: No acute distress.  Patient appears well-groomed.   Head:  Normocephalic/atraumatic Eyes:  fundi examined but not visualized Neck: supple, no paraspinal tenderness, full range of motion Back: No paraspinal tenderness Heart: regular rate and rhythm Lungs: Clear to auscultation bilaterally. Vascular: No carotid bruits. Neurological Exam: Mental status: alert and oriented to person, place, and time, recent and remote memory intact, fund of knowledge intact, attention and concentration intact, speech fluent and not dysarthric, language intact. Cranial nerves: CN I: not tested CN II: pupils equal, round and reactive to light, visual fields intact CN III, IV, VI:  full range of motion, no nystagmus, no ptosis CN V: facial sensation intact. CN VII: upper and lower face symmetric CN VIII: hearing intact CN IX, X: gag intact, uvula midline CN XI: sternocleidomastoid and trapezius muscles intact CN XII: tongue midline Bulk & Tone: normal, no fasciculations. Motor:  muscle strength 5/5 throughout Sensation:  Pinprick, temperature and vibratory sensation intact. Deep Tendon Reflexes:  2+ throughout,  toes downgoing.   Finger to nose testing:  Without dysmetria.   Heel to shin:  Without dysmetria.   Gait:  Normal station and stride.  Romberg negative.      11/09/2021    8:00 AM  Montreal Cognitive Assessment   Visuospatial/ Executive (0/5) 4  Naming (0/3) 3  Attention: Read list of digits (0/2) 2  Attention: Read list of letters (0/1) 1  Attention: Serial 7 subtraction starting at 100 (0/3) 3  Language: Repeat phrase (0/2) 2  Language : Fluency (0/1) 1  Abstraction (0/2) 1  Delayed Recall (0/5) 2  Orientation (0/6) 6   Total 25        No data to display            Thank you for allowing 12-08-1995 the opportunity to participate in the care of this nice patient. Please do not hesitate to contact us for any questions or concerns.   Total time spent on today's visit was 60 minutes dedicated to this patient today, preparing to see patient, examining the patient, ordering tests and/or medications and counseling the patient, documenting clinical information in the EHR or other health record, independently interpreting results and communicating results to the patient/family, discussing treatment and goals, answering patient's questions and coordinating care.  Cc:  [place], MD  Tally Joe 04/21/2022 7:26 AM

## 2022-04-20 NOTE — Patient Instructions (Signed)
   Contact us in 4 weeks with update and we can increase dose if needed. Take Sumatriptan 50 mg  at earliest onset of headache.  May repeat dose once in 2 hours if needed.  Maximum 2 tablets in 24 hours. Limit use of pain relievers to no more than 2 days out of the week.  These medications include acetaminophen, NSAIDs (ibuprofen/Advil/Motrin, naproxen/Aleve, triptans (Imitrex/sumatriptan), Excedrin, and narcotics.  This will help reduce risk of rebound headaches. Be aware of common food triggers:  - Caffeine:  coffee, black tea, cola, Mt. Dew  - Chocolate  - Dairy:  aged cheeses (brie, blue, cheddar, gouda, Parmasan, provolone, romano, Swiss, etc), chocolate milk, buttermilk, sour cream, limit eggs and yogurt  - Nuts, peanut butter  - Alcohol  - Cereals/grains:  FRESH breads (fresh bagels, sourdough, doughnuts), yeast productions  - Processed/canned/aged/cured meats (pre-packaged deli meats, hotdogs)  - MSG/glutamate:  soy sauce, flavor enhancer, pickled/preserved/marinated foods  - Sweeteners:  aspartame (Equal, Nutrasweet).  Sugar and Splenda are okay  - Vegetables:  legumes (lima beans, lentils, snow peas, fava beans, pinto peans, peas, garbanzo beans), sauerkraut, onions, olives, pickles  - Fruit:  avocados, bananas, citrus fruit (orange, lemon, grapefruit), mango  - Other:  Frozen meals, macaroni and cheese Routine exercise Stay adequately hydrated (aim for 64 oz water daily) Keep headache diary Maintain proper stress management Maintain proper sleep hygiene Do not skip meals Consider supplements:  magnesium citrate 400mg daily, riboflavin 400mg daily, coenzyme Q10 100mg three times daily.  

## 2022-05-09 DIAGNOSIS — B078 Other viral warts: Secondary | ICD-10-CM | POA: Diagnosis not present

## 2022-05-11 ENCOUNTER — Ambulatory Visit (HOSPITAL_BASED_OUTPATIENT_CLINIC_OR_DEPARTMENT_OTHER): Payer: BC Managed Care – PPO | Admitting: Cardiology

## 2022-05-11 DIAGNOSIS — M545 Low back pain, unspecified: Secondary | ICD-10-CM | POA: Diagnosis not present

## 2022-05-11 DIAGNOSIS — M170 Bilateral primary osteoarthritis of knee: Secondary | ICD-10-CM | POA: Diagnosis not present

## 2022-05-11 DIAGNOSIS — M62830 Muscle spasm of back: Secondary | ICD-10-CM | POA: Diagnosis not present

## 2022-05-11 DIAGNOSIS — M25552 Pain in left hip: Secondary | ICD-10-CM | POA: Diagnosis not present

## 2022-05-15 ENCOUNTER — Encounter (HOSPITAL_BASED_OUTPATIENT_CLINIC_OR_DEPARTMENT_OTHER): Payer: Self-pay | Admitting: Cardiology

## 2022-05-15 ENCOUNTER — Ambulatory Visit (INDEPENDENT_AMBULATORY_CARE_PROVIDER_SITE_OTHER): Payer: No Typology Code available for payment source | Admitting: Cardiology

## 2022-05-15 VITALS — BP 124/82 | HR 58 | Ht 69.0 in | Wt 218.8 lb

## 2022-05-15 DIAGNOSIS — E7800 Pure hypercholesterolemia, unspecified: Secondary | ICD-10-CM

## 2022-05-15 DIAGNOSIS — I10 Essential (primary) hypertension: Secondary | ICD-10-CM | POA: Diagnosis not present

## 2022-05-15 DIAGNOSIS — Z7189 Other specified counseling: Secondary | ICD-10-CM | POA: Diagnosis not present

## 2022-05-15 DIAGNOSIS — I351 Nonrheumatic aortic (valve) insufficiency: Secondary | ICD-10-CM

## 2022-05-15 NOTE — Patient Instructions (Signed)
Medication Instructions:  Your physician recommends that you continue on your current medications as directed. Please refer to the Current Medication list given to you today.   *If you need a refill on your cardiac medications before your next appointment, please call your pharmacy*  Lab Work: NONE  Testing/Procedures: Your physician has requested that you have an echocardiogram. Echocardiography is a painless test that uses sound waves to create images of your heart. It provides your doctor with information about the size and shape of your heart and how well your heart's chambers and valves are working. This procedure takes approximately one hour. There are no restrictions for this procedure.  JUNE 2024 [facility]   Follow-Up: At [facility], you and your health needs are our priority.  As part of our continuing mission to provide you with exceptional heart care, we have created designated Provider Care Teams.  These Care Teams include your primary Cardiologist (physician) and Advanced Practice Providers (APPs -  Physician Assistants and Nurse Practitioners) who all work together to provide you with the care you need, when you need it.  We recommend signing up for the patient portal called "MyChart".  Sign up information is provided on this After Visit Summary.  MyChart is used to connect with patients for Virtual Visits (Telemedicine).  Patients are able to view lab/test results, encounter notes, upcoming appointments, etc.  Non-urgent messages can be sent to your provider as well.   To learn more about what you can do with MyChart, go to [email or web address].    Your next appointment:   JULY 2024

## 2022-05-15 NOTE — Progress Notes (Signed)
Cardiology Office Note:    Date:  05/15/2022   ID:  Greg Dillon, DOB 10-Jul-1966, MRN [id number]  PCP:  Tally Joe, MD  Cardiologist:  Jodelle Red, MD  Referring MD: Tally Joe, MD   CC: new patient evaluation for aortic regurgitation  History of Present Illness:    Greg Dillon is a 56 y.o. male with a hx of hypertension, heart murmur, and hyperlipidemia who is seen as a new consult at the request of Tally Joe, MD for the evaluation and management of aortic regurgitation.  No records available from the [place]. Previously followed by [facility]. cMRI 6/023 with EF 50%, ESD 4.6 cm (EDD 6.0), normal aortic root, moderate AR  Today:   He appears to be doing well. He first found out about his heart murmur in 2012. Heart murmur and anatomy of the heart was discussed at length today.  He expresses a desire for managing stress and his PTSD diagnosis. He states he has experienced times where he cannot accurately remember things. He would feel stress and the need to urinate at this time, and saw some elevated blood pressure.   He states that his blood pressure was around 140/100 from 2012 to 2014. He was put on blood pressure medications, and has been on amlodipine ever since. He does not take amlodipine every day, but has been more consistent with it since his BP was elevated earlier this year. He endorses that since he began exercising more frequently, his heart rate has been lower. He has a blood pressure cuff at home which he uses every other week or so. The highest he's seen has been 138/92, but states this is not a constant reading.  He enjoys being physically active. He exercises 3-5 days a week for 60 minutes at a time. He performs combinations of pull-ups, body weight squats, bench presses, elliptical, boxing, and more.  He reports he has night sweats every now and then.  He denies any palpitations, chest pain, shortness of breath, or peripheral edema. No  lightheadedness, headaches, syncope, orthopnea, or PND. He denies stroke symptoms and fever, chills.  Past Medical History:  Diagnosis Date   Heart murmur    Herpes    High cholesterol    Hypertension     Past Surgical History:  Procedure Laterality Date   EYE SURGERY Right    LUMBAR EPIDURAL INJECTION      Current Medications: Current Outpatient Medications on File Prior to Visit  Medication Sig   amLODipine (NORVASC) 10 MG tablet Take 10 mg by mouth daily.   aspirin 81 MG chewable tablet 1 tablet   atorvastatin (LIPITOR) 40 MG tablet Take 40 mg by mouth daily.   busPIRone (BUSPAR) 10 MG tablet Take 10 mg by mouth 3 (three) times daily.   cholecalciferol (VITAMIN D3) 25 MCG (1000 UNIT) tablet Take 2,000 Units by mouth daily.   cyclobenzaprine (FLEXERIL) 10 MG tablet    diazepam (VALIUM) 5 MG tablet Take 1 tablet (5 mg total) by mouth 2 (two) times daily.   fluticasone (FLONASE) 50 MCG/ACT nasal spray Place 2 sprays into both nostrils daily.   hydrOXYzine (ATARAX) 10 MG tablet Take 10 mg by mouth 2 (two) times daily as needed.   ondansetron (ZOFRAN-ODT) 4 MG disintegrating tablet Take 4 mg by mouth every 8 (eight) hours as needed.   oxyCODONE-acetaminophen (PERCOCET/ROXICET) 5-325 MG tablet Take 2 tablets by mouth every 4 (four) hours as needed for severe pain.   valACYclovir (VALTREX) 500 MG tablet Take 500  mg by mouth daily.   No current facility-administered medications on file prior to visit.     Allergies:   Azithromycin, Losartan, Povidone iodine, and Tessalon [benzonatate]   Social History   Tobacco Use   Smoking status: Some Days    Types: Cigars   Smokeless tobacco: Never  Vaping Use   Vaping Use: Never used  Substance Use Topics   Alcohol use: Not Currently    Comment: was drinking reg. last drink in July   Drug use: No    Family History: family history includes Diabetes Mellitus I in his father; Hyperlipidemia in his father; Hypertension in his mother  and sister; Seizures in his brother.  ROS:   Please see the history of present illness.   (+) PTSD (+) Stress Additional pertinent ROS otherwise unremarkable.   EKGs/Labs/Other Studies Reviewed:    The following studies were reviewed today: Previously followed by [facility]. cMRI 6/023 with EF 50%, ESD 4.6 cm (EDD 6.0), normal aortic root, moderate AR  Echo 09/09/2015: Study Conclusions   - Left ventricle: The cavity size was normal. Wall thickness was    normal. Systolic function was normal. The estimated ejection    fraction was in the range of 50% to 55%. Wall motion was normal;    there were no regional wall motion abnormalities. Left    ventricular diastolic function parameters were normal.  - Aortic valve: There was mild to moderate regurgitation directed    centrally in the LVOT.   EKG:  EKG is personally reviewed.   05/15/22: sinus bradycardia at 58 bpm, LAD   Recent Labs: No results found for requested labs within last 365 days.  Recent Lipid Panel No results found for: "CHOL", "TRIG", "HDL", "CHOLHDL", "VLDL", "LDLCALC", "LDLDIRECT"  Physical Exam:    VS:  BP 124/82 (BP Location: Right Arm, Patient Position: Sitting, Cuff Size: Large)   Pulse (!) 55   Ht 5\' 9"  (1.753 m)   Wt 218 lb 12.8 oz (99.2 kg)   BMI 32.31 kg/m     Wt Readings from Last 3 Encounters:  05/15/22 218 lb 12.8 oz (99.2 kg)  04/20/22 221 lb (100.2 kg)  11/09/21 216 lb (98 kg)    GEN: Well nourished, well developed in no acute distress HEENT: Normal, moist mucous membranes NECK: No JVD CARDIAC: regular rhythm, normal S1 and S2, no rubs or gallops. No murmur appreciated. VASCULAR: Radial and DP pulses 2+ bilaterally. No carotid bruits RESPIRATORY:  Clear to auscultation without rales, wheezing or rhonchi  ABDOMEN: Soft, non-tender, non-distended MUSCULOSKELETAL:  Ambulates independently SKIN: Warm and dry, no edema NEUROLOGIC:  Alert and oriented x 3. No focal neuro deficits noted. PSYCHIATRIC:   Normal affect    ASSESSMENT:    1. Nonrheumatic aortic valve insufficiency   2. Essential hypertension   3. Pure hypercholesterolemia   4. Cardiac risk counseling   5. Counseling on health promotion and disease prevention    PLAN:    Moderate aortic regurgitation -per [facility] notes, initially diagnosed ~2018 -asymptomatic -last evaluated by MRI 02/2022. Repeat echo 02/2023  Hypercholesterolemia -last LDL 123, on atorvastatin 40 mg -no known CAD -on aspirin per patient preference  Hypertension -near goal today -continue amlodipine  Cardiac risk counseling and prevention recommendations: -recommend heart healthy/Mediterranean diet, with whole grains, fruits, vegetable, fish, lean meats, nuts, and olive oil. Limit salt. -recommend moderate walking, 3-5 times/week for 30-50 minutes each session. Aim for at least 150 minutes.week. Goal should be pace of 3 miles/hours, or walking  1.5 miles in 30 minutes -recommend avoidance of tobacco products. Avoid excess alcohol.  Plan for follow up: After Echo in June 2024  Jodelle Red, MD, PhD, [facility]  [facility]    Medication Adjustments/Labs and Tests Ordered: Current medicines are reviewed at length with the patient today.  Concerns regarding medicines are outlined above.  Orders Placed This Encounter  Procedures   EKG 12-Lead   ECHOCARDIOGRAM COMPLETE   No orders of the defined types were placed in this encounter.   Patient Instructions  Medication Instructions:  Your physician recommends that you continue on your current medications as directed. Please refer to the Current Medication list given to you today.   *If you need a refill on your cardiac medications before your next appointment, please call your pharmacy*  Lab Work: NONE  Testing/Procedures: Your physician has requested that you have an echocardiogram. Echocardiography is a painless test that uses sound waves to create images of your heart.  It provides your doctor with information about the size and shape of your heart and how well your heart's chambers and valves are working. This procedure takes approximately one hour. There are no restrictions for this procedure.  JUNE 2024 [facility]   Follow-Up: At [facility], you and your health needs are our priority.  As part of our continuing mission to provide you with exceptional heart care, we have created designated Provider Care Teams.  These Care Teams include your primary Cardiologist (physician) and Advanced Practice Providers (APPs -  Physician Assistants and Nurse Practitioners) who all work together to provide you with the care you need, when you need it.  We recommend signing up for the patient portal called "MyChart".  Sign up information is provided on this After Visit Summary.  MyChart is used to connect with patients for Virtual Visits (Telemedicine).  Patients are able to view lab/test results, encounter notes, upcoming appointments, etc.  Non-urgent messages can be sent to your provider as well.   To learn more about what you can do with MyChart, go to [email or web address].    Your next appointment:   JULY 2024         I,Breanna Adamick,acting as a scribe for Jodelle Red, MD.,have documented all relevant documentation on the behalf of Jodelle Red, MD,as directed by  Jodelle Red, MD while in the presence of Jodelle Red, MD.   I, Jodelle Red, MD, have reviewed all documentation for this visit. The documentation on 06/04/22 for the exam, diagnosis, procedures, and orders are all accurate and complete.   Signed, Jodelle Red, MD PhD 05/15/2022     [facility]

## 2022-05-18 DIAGNOSIS — R1310 Dysphagia, unspecified: Secondary | ICD-10-CM | POA: Diagnosis not present

## 2022-05-18 DIAGNOSIS — M25562 Pain in left knee: Secondary | ICD-10-CM | POA: Diagnosis not present

## 2022-05-18 DIAGNOSIS — M545 Low back pain, unspecified: Secondary | ICD-10-CM | POA: Diagnosis not present

## 2022-05-18 DIAGNOSIS — M25561 Pain in right knee: Secondary | ICD-10-CM | POA: Diagnosis not present

## 2022-05-18 DIAGNOSIS — G8929 Other chronic pain: Secondary | ICD-10-CM | POA: Diagnosis not present

## 2022-05-25 DIAGNOSIS — K219 Gastro-esophageal reflux disease without esophagitis: Secondary | ICD-10-CM | POA: Diagnosis not present

## 2022-05-25 DIAGNOSIS — J3089 Other allergic rhinitis: Secondary | ICD-10-CM | POA: Diagnosis not present

## 2022-05-25 DIAGNOSIS — R4702 Dysphasia: Secondary | ICD-10-CM | POA: Diagnosis not present

## 2022-05-25 DIAGNOSIS — T781XXA Other adverse food reactions, not elsewhere classified, initial encounter: Secondary | ICD-10-CM | POA: Diagnosis not present

## 2022-05-29 DIAGNOSIS — F1020 Alcohol dependence, uncomplicated: Secondary | ICD-10-CM | POA: Diagnosis not present

## 2022-05-31 DIAGNOSIS — M25561 Pain in right knee: Secondary | ICD-10-CM | POA: Diagnosis not present

## 2022-05-31 DIAGNOSIS — M25562 Pain in left knee: Secondary | ICD-10-CM | POA: Diagnosis not present

## 2022-06-04 ENCOUNTER — Encounter (HOSPITAL_BASED_OUTPATIENT_CLINIC_OR_DEPARTMENT_OTHER): Payer: Self-pay | Admitting: Cardiology

## 2022-06-06 DIAGNOSIS — M2012 Hallux valgus (acquired), left foot: Secondary | ICD-10-CM | POA: Diagnosis not present

## 2022-06-06 DIAGNOSIS — M2141 Flat foot [pes planus] (acquired), right foot: Secondary | ICD-10-CM | POA: Diagnosis not present

## 2022-06-06 DIAGNOSIS — M21612 Bunion of left foot: Secondary | ICD-10-CM | POA: Diagnosis not present

## 2022-06-06 DIAGNOSIS — M2142 Flat foot [pes planus] (acquired), left foot: Secondary | ICD-10-CM | POA: Diagnosis not present

## 2022-06-08 DIAGNOSIS — F1020 Alcohol dependence, uncomplicated: Secondary | ICD-10-CM | POA: Diagnosis not present

## 2022-06-12 DIAGNOSIS — B078 Other viral warts: Secondary | ICD-10-CM | POA: Diagnosis not present

## 2022-06-15 DIAGNOSIS — F1020 Alcohol dependence, uncomplicated: Secondary | ICD-10-CM | POA: Diagnosis not present

## 2022-06-19 DIAGNOSIS — M25562 Pain in left knee: Secondary | ICD-10-CM | POA: Diagnosis not present

## 2022-06-19 DIAGNOSIS — M25561 Pain in right knee: Secondary | ICD-10-CM | POA: Diagnosis not present

## 2022-06-19 DIAGNOSIS — R269 Unspecified abnormalities of gait and mobility: Secondary | ICD-10-CM | POA: Diagnosis not present

## 2022-06-19 DIAGNOSIS — R2689 Other abnormalities of gait and mobility: Secondary | ICD-10-CM | POA: Diagnosis not present

## 2022-06-21 DIAGNOSIS — M542 Cervicalgia: Secondary | ICD-10-CM | POA: Diagnosis not present

## 2022-06-22 DIAGNOSIS — F1020 Alcohol dependence, uncomplicated: Secondary | ICD-10-CM | POA: Diagnosis not present

## 2022-06-29 DIAGNOSIS — F1020 Alcohol dependence, uncomplicated: Secondary | ICD-10-CM | POA: Diagnosis not present

## 2022-07-06 DIAGNOSIS — F1020 Alcohol dependence, uncomplicated: Secondary | ICD-10-CM | POA: Diagnosis not present

## 2022-07-11 DIAGNOSIS — Z0189 Encounter for other specified special examinations: Secondary | ICD-10-CM | POA: Diagnosis not present

## 2022-07-11 DIAGNOSIS — Z83711 Family history of hyperplastic colon polyps: Secondary | ICD-10-CM | POA: Diagnosis not present

## 2022-07-13 DIAGNOSIS — B078 Other viral warts: Secondary | ICD-10-CM | POA: Diagnosis not present

## 2022-07-20 DIAGNOSIS — F1020 Alcohol dependence, uncomplicated: Secondary | ICD-10-CM | POA: Diagnosis not present

## 2022-08-01 DIAGNOSIS — E119 Type 2 diabetes mellitus without complications: Secondary | ICD-10-CM | POA: Diagnosis not present

## 2022-08-01 DIAGNOSIS — H04123 Dry eye syndrome of bilateral lacrimal glands: Secondary | ICD-10-CM | POA: Diagnosis not present

## 2022-08-01 DIAGNOSIS — H25813 Combined forms of age-related cataract, bilateral: Secondary | ICD-10-CM | POA: Diagnosis not present

## 2022-08-01 DIAGNOSIS — H524 Presbyopia: Secondary | ICD-10-CM | POA: Diagnosis not present

## 2022-08-01 DIAGNOSIS — H40033 Anatomical narrow angle, bilateral: Secondary | ICD-10-CM | POA: Diagnosis not present

## 2022-08-01 DIAGNOSIS — Z5189 Encounter for other specified aftercare: Secondary | ICD-10-CM | POA: Diagnosis not present

## 2022-08-16 DIAGNOSIS — L989 Disorder of the skin and subcutaneous tissue, unspecified: Secondary | ICD-10-CM | POA: Diagnosis not present

## 2022-08-16 DIAGNOSIS — Z1389 Encounter for screening for other disorder: Secondary | ICD-10-CM | POA: Diagnosis not present

## 2022-08-31 DIAGNOSIS — F1020 Alcohol dependence, uncomplicated: Secondary | ICD-10-CM | POA: Diagnosis not present

## 2022-09-05 DIAGNOSIS — E1169 Type 2 diabetes mellitus with other specified complication: Secondary | ICD-10-CM | POA: Diagnosis not present

## 2022-09-05 DIAGNOSIS — E782 Mixed hyperlipidemia: Secondary | ICD-10-CM | POA: Diagnosis not present

## 2022-09-05 DIAGNOSIS — F4310 Post-traumatic stress disorder, unspecified: Secondary | ICD-10-CM | POA: Diagnosis not present

## 2022-09-05 DIAGNOSIS — Z23 Encounter for immunization: Secondary | ICD-10-CM | POA: Diagnosis not present

## 2022-09-05 DIAGNOSIS — I10 Essential (primary) hypertension: Secondary | ICD-10-CM | POA: Diagnosis not present

## 2022-09-05 DIAGNOSIS — Z0000 Encounter for general adult medical examination without abnormal findings: Secondary | ICD-10-CM | POA: Diagnosis not present

## 2022-09-05 DIAGNOSIS — B078 Other viral warts: Secondary | ICD-10-CM | POA: Diagnosis not present

## 2022-09-05 DIAGNOSIS — Z125 Encounter for screening for malignant neoplasm of prostate: Secondary | ICD-10-CM | POA: Diagnosis not present

## 2022-09-06 DIAGNOSIS — F1090 Alcohol use, unspecified, uncomplicated: Secondary | ICD-10-CM | POA: Diagnosis not present

## 2022-09-06 DIAGNOSIS — R490 Dysphonia: Secondary | ICD-10-CM | POA: Diagnosis not present

## 2022-09-06 DIAGNOSIS — F17290 Nicotine dependence, other tobacco product, uncomplicated: Secondary | ICD-10-CM | POA: Diagnosis not present

## 2022-09-21 DIAGNOSIS — F1020 Alcohol dependence, uncomplicated: Secondary | ICD-10-CM | POA: Diagnosis not present

## 2022-10-03 DIAGNOSIS — B078 Other viral warts: Secondary | ICD-10-CM | POA: Diagnosis not present

## 2022-10-05 DIAGNOSIS — J029 Acute pharyngitis, unspecified: Secondary | ICD-10-CM | POA: Diagnosis not present

## 2022-10-05 DIAGNOSIS — Z20822 Contact with and (suspected) exposure to covid-19: Secondary | ICD-10-CM | POA: Diagnosis not present

## 2022-10-05 DIAGNOSIS — M549 Dorsalgia, unspecified: Secondary | ICD-10-CM | POA: Diagnosis not present

## 2022-10-05 DIAGNOSIS — U071 COVID-19: Secondary | ICD-10-CM | POA: Diagnosis not present

## 2022-10-17 DIAGNOSIS — R0989 Other specified symptoms and signs involving the circulatory and respiratory systems: Secondary | ICD-10-CM | POA: Diagnosis not present

## 2022-10-17 DIAGNOSIS — R0602 Shortness of breath: Secondary | ICD-10-CM | POA: Diagnosis not present

## 2022-10-17 DIAGNOSIS — R059 Cough, unspecified: Secondary | ICD-10-CM | POA: Diagnosis not present

## 2022-10-17 DIAGNOSIS — R0982 Postnasal drip: Secondary | ICD-10-CM | POA: Diagnosis not present

## 2022-10-24 ENCOUNTER — Ambulatory Visit: Payer: No Typology Code available for payment source | Admitting: Physician Assistant

## 2022-10-24 DIAGNOSIS — R21 Rash and other nonspecific skin eruption: Secondary | ICD-10-CM | POA: Diagnosis not present

## 2022-10-26 DIAGNOSIS — R399 Unspecified symptoms and signs involving the genitourinary system: Secondary | ICD-10-CM | POA: Diagnosis not present

## 2022-11-02 ENCOUNTER — Ambulatory Visit (INDEPENDENT_AMBULATORY_CARE_PROVIDER_SITE_OTHER): Payer: BC Managed Care – PPO | Admitting: Physician Assistant

## 2022-11-02 VITALS — BP 137/86 | HR 73 | Resp 20 | Ht 69.0 in | Wt 219.0 lb

## 2022-11-02 DIAGNOSIS — G8929 Other chronic pain: Secondary | ICD-10-CM | POA: Diagnosis not present

## 2022-11-02 DIAGNOSIS — R519 Headache, unspecified: Secondary | ICD-10-CM | POA: Diagnosis not present

## 2022-11-02 MED ORDER — SUMATRIPTAN SUCCINATE 50 MG PO TABS: ORAL_TABLET | ORAL | 10 refills | Status: DC

## 2022-11-02 NOTE — Progress Notes (Signed)
Assessment/Plan:   Chronic Non Intractable Headaches    For preventative management, None  For abortive therapy, continue Sumatriptan 50 mg at the first onset of headaches, can take another dose 2 h prn, max 2 a day  as per PCP   Limit use of pain relievers to no more than 2 days out of week to prevent risk of rebound or medication-overuse headache.  Keep headache diary  Exercise, hydration, caffeine cessation, sleep hygiene, monitor for and avoid triggers Alcohol cessation is recommended   Consider:  magnesium citrate '400mg'$  daily, riboflavin '400mg'$  daily, and coenzyme Q10 '100mg'$  three times daily Follow up 6 months     Subjective:   This patient is here alone  Previous records as well as any outside records available were reviewed prior to todays visit.     Onset: started at age [age], in [place] had some episodes and but were never treated with medicines.  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 Since then, when internalizing issues, his HA is worse.  Sometimes, it interferes with comprehension.  Of note, in the recent past the patient had been evaluated for memory issues, yielding completely normal evaluation and imaging. His memory is better since he retired and has less stressed, he is making an effort to "organize the thoughts" Quality: 8-10  Intensity: Sharp, like a knife  Location: bandlike in the frontal area and a burning sensation in the back of the head . Sometimes he feels "behind the eyeballs " Duration: 1 hour Frequency:  "about 3  times a month and nothing for a while" ."they come in blocks"  Associated symptoms: sometimes nausea, no vomiting.  Aura: Endorsed. " Lately no stars"  Activity: In the winter, I am not as active, so maybe if I am in the computer too long may trigger. TV does not bother me  Aggravating factors: now that I am not working I have less stress" Relieving factors: lies down, puts a towel on the  forehead, light and sounds may make it worse . Working out is relaxing to him   Current abortive medications: Sumatriptan  50 mg at the first onset of HA, max 4 in a day,( last in Dec 2023 ).  Tylenol as needed Current prophylactic medications: None  Past abortive medications:  BC powder 2-3 a week , Excedrin, Goody Powder . Past prophylactic medications:   Family history: Negative for migraines. Smoker: cigars  "Once every 4 months"  Alcohol: not recently. Stopped in July ," taking a medicine for it. Taking one beer and shot , on the weekend or every other weekend ". I am taking a meditation and tai chi class". Caffeine: 16-32  a day of coffee .   Sleep:  better since not working, he is on disability because he could not leave the meetings as before. No more than 6-7  hours a night.  MRI brain 11/2021 Unremarkable MRI appearance of the brain. No evidence of acute intracranial abnormality.   Past Medical History:  Diagnosis Date   Heart murmur    Herpes    High cholesterol    Hypertension      Past Surgical History:  Procedure Laterality Date   EYE SURGERY Right    LUMBAR EPIDURAL INJECTION       PREVIOUS MEDICATIONS:   CURRENT MEDICATIONS:  Outpatient Encounter Medications as of 11/02/2022  Medication Sig   amLODipine (NORVASC) 10 MG tablet Take 10 mg by  mouth daily.   aspirin 81 MG chewable tablet 1 tablet   atorvastatin (LIPITOR) 40 MG tablet Take 40 mg by mouth daily.   busPIRone (BUSPAR) 15 MG tablet TAKE ONE TABLET BY MOUTH THREE TIMES A DAY FOR ANXIETY   cholecalciferol (VITAMIN D3) 25 MCG (1000 UNIT) tablet Take 2,000 Units by mouth daily.   cyclobenzaprine (FLEXERIL) 10 MG tablet    diazepam (VALIUM) 5 MG tablet Take 1 tablet (5 mg total) by mouth 2 (two) times daily.   fluticasone (FLONASE) 50 MCG/ACT nasal spray Place 2 sprays into both nostrils daily.   hydrOXYzine (ATARAX) 10 MG tablet Take 10 mg by mouth 2 (two) times daily as needed.   naltrexone (DEPADE) 50  MG tablet Take 1 tablet by mouth every morning.   ondansetron (ZOFRAN-ODT) 4 MG disintegrating tablet Take 4 mg by mouth every 8 (eight) hours as needed.   oxyCODONE-acetaminophen (PERCOCET/ROXICET) 5-325 MG tablet Take 2 tablets by mouth every 4 (four) hours as needed for severe pain.   sertraline (ZOLOFT) 100 MG tablet TAKE ONE AND ONE-HALF TABLETS BY MOUTH EVERY MORNING AFTER BREAKFAST FOR ANXIETY, PTSD, AND MOOD   valACYclovir (VALTREX) 500 MG tablet Take 500 mg by mouth daily.   [DISCONTINUED] SUMAtriptan (IMITREX) 50 MG tablet TAKE ONE TABLET BY MOUTH AS DIRECTED AS NEEDED FOR MIGRAINE HEADACHE (FIRST DOSE AT ONSET OF HEADACHE,THEN REPEAT AFTER 2 HOURS IF NO RELIEF. NO MORE THAN 200 MG PER DAY)   SUMAtriptan (IMITREX) 50 MG tablet TAKE ONE TABLET BY MOUTH AS DIRECTED AS NEEDED FOR MIGRAINE HEADACHE (FIRST DOSE AT ONSET OF HEADACHE,THEN REPEAT AFTER 2 HOURS IF NO RELIEF. NO MORE THAN 200 MG PER DAY)   No facility-administered encounter medications on file as of 11/02/2022.     Objective:     PHYSICAL EXAMINATION:    VITALS:   Vitals:   11/02/22 1102  BP: 137/86  Pulse: 73  Resp: 20  SpO2: 97%  Weight: 219 lb (99.3 kg)  Height: '5\' 9"'$  (1.753 m)     GEN:  The patient appears stated age and is in NAD. HEENT:  Normocephalic, atraumatic.   Neurological examination:  General: No acute distress.  Patient appears well-groomed.   Head:  Normocephalic/atraumatic Eyes:  fundi examined but not visualized Neck: supple, no paraspinal tenderness, full range of motion Back: No paraspinal tenderness Heart: regular rate and rhythm Lungs: Clear to auscultation bilaterally. Vascular: No carotid bruits. Neurological Exam: Mental status: alert and oriented to person, place, and time, recent and remote memory intact, fund of knowledge intact, attention and concentration intact, speech fluent and not dysarthric, language intact. Cranial nerves: CN I: not tested CN II: pupils equal, round and  reactive to light, visual fields intact CN III, IV, VI:  full range of motion, no nystagmus, no ptosis CN V: facial sensation intact. CN VII: upper and lower face symmetric CN VIII: hearing intact CN IX, X: gag intact, uvula midline CN XI: sternocleidomastoid and trapezius muscles intact CN XII: tongue midline Bulk & Tone: normal, no fasciculations. Motor:  muscle strength 5/5 throughout Sensation:  Pinprick, temperature and vibratory sensation intact. Deep Tendon Reflexes:  2+ throughout,  toes downgoing.   Finger to nose testing:  Without dysmetria.   Heel to shin:  Without dysmetria.   Gait:  Normal station and stride.  Romberg negative.      11/09/2021    8:00 AM  Montreal Cognitive Assessment   Visuospatial/ Executive (0/5) 4  Naming (0/3) 3  Attention: Read list  of digits (0/2) 2  Attention: Read list of letters (0/1) 1  Attention: Serial 7 subtraction starting at 100 (0/3) 3  Language: Repeat phrase (0/2) 2  Language : Fluency (0/1) 1  Abstraction (0/2) 1  Delayed Recall (0/5) 2  Orientation (0/6) 6  Total 25        No data to display            Thank you for allowing [place] the opportunity to participate in the care of this nice patient. Please do not hesitate to contact us for any questions or concerns.   Total time spent on today's visit was 60 minutes dedicated to this patient today, preparing to see patient, examining the patient, ordering tests and/or medications and counseling the patient, documenting clinical information in the EHR or other health record, independently interpreting results and communicating results to the patient/family, discussing treatment and goals, answering patient's questions and coordinating care.  Cc:  Antony Contras, MD  Sharene Butters 11/02/2022 11:36 AM

## 2022-11-02 NOTE — Patient Instructions (Signed)
   Contact us in 4 weeks with update and we can increase dose if needed. Take Sumatriptan 50 mg  at earliest onset of headache.  May repeat dose once in 2 hours if needed.  Maximum 2 tablets in 24 hours. Limit use of pain relievers to no more than 2 days out of the week.  These medications include acetaminophen, NSAIDs (ibuprofen/Advil/Motrin, naproxen/Aleve, triptans (Imitrex/sumatriptan), Excedrin, and narcotics.  This will help reduce risk of rebound headaches. Be aware of common food triggers:  - Caffeine:  coffee, black tea, cola, Mt. Dew  - Chocolate  - Dairy:  aged cheeses (brie, blue, cheddar, gouda, [place], provolone, [place], Swiss, etc), chocolate milk, buttermilk, sour cream, limit eggs and yogurt  - Nuts, peanut butter  - Alcohol  - Cereals/grains:  FRESH breads (fresh bagels, sourdough, doughnuts), yeast productions  - Processed/canned/aged/cured meats (pre-packaged deli meats, hotdogs)  - MSG/glutamate:  soy sauce, flavor enhancer, pickled/preserved/marinated foods  - Sweeteners:  aspartame (Equal, Nutrasweet).  Sugar and Splenda are okay  - Vegetables:  legumes (lima beans, lentils, snow peas, fava beans, pinto peans, peas, garbanzo beans), sauerkraut, onions, olives, pickles  - Fruit:  avocados, bananas, citrus fruit (orange, lemon, grapefruit), mango  - Other:  Frozen meals, macaroni and cheese Routine exercise Stay adequately hydrated (aim for 64 oz water daily) Keep headache diary Maintain proper stress management Maintain proper sleep hygiene Do not skip meals Consider supplements:  magnesium citrate 496m daily, riboflavin 4049mdaily, coenzyme Q10 10013mhree times daily.

## 2022-12-01 DIAGNOSIS — B078 Other viral warts: Secondary | ICD-10-CM | POA: Diagnosis not present

## 2022-12-28 ENCOUNTER — Telehealth: Payer: Self-pay | Admitting: Cardiology

## 2022-12-28 DIAGNOSIS — I351 Nonrheumatic aortic (valve) insufficiency: Secondary | ICD-10-CM

## 2022-12-28 NOTE — Telephone Encounter (Signed)
Returned call to patient to explain obtaining prior authorization from payer is routinely done.  Pt verbalized understanding and requesting ECHO be scheduled at [facility].  ECHO ordered entered and sent for scheduling [facility] RN CCM

## 2022-12-28 NOTE — Telephone Encounter (Signed)
Patient is calling because he needs to schedule his Echo, but patient would like to make sure that the [place] approves the echo before scheduling. Patient would like to know for cost purposes. Please advise

## 2023-01-02 IMAGING — MR MR HEAD WO/W CM
12 series · 48 of 48 positions shown · IV contrast (multihance)
Comparison: No pertinent prior exams available for comparison.

CLINICAL DATA: Memory difficulties. Memory loss, history of PTSD.
Additional history provided by scanning technologist: Patient
reports memory loss/confusion for several weeks.

EXAM:
MRI HEAD WITHOUT AND WITH CONTRAST
TECHNIQUE: Multiplanar, multiecho pulse sequences of the brain and surrounding
structures were obtained without and with intravenous contrast.
CONTRAST:  20mL MULTIHANCE GADOBENATE DIMEGLUMINE 529 MG/ML IV SOLN

[Series 2: T1 · sagittal · 5.0mm · 0.45mm/px · 2 of 23 slices shown]
[im 1/23]
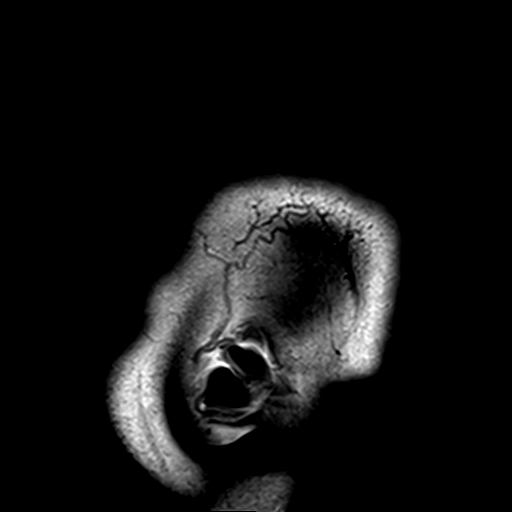
[im 23/23]
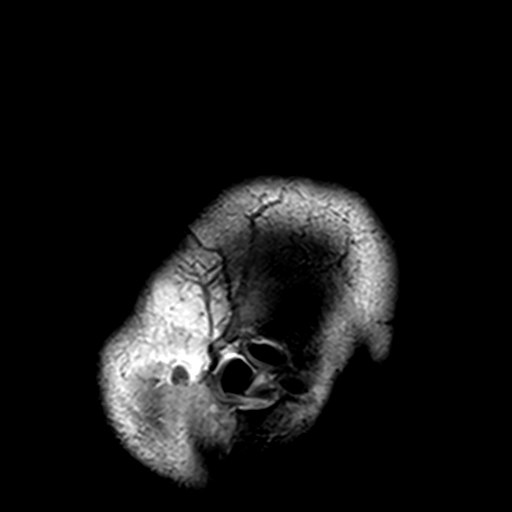

[Series 3: DWI · axial · 3.0mm · 1.80mm/px · z∈[-40,+96]mm · 7 of 104 slices shown (1 of 4)]
[im 1/104]
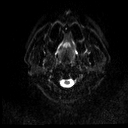
[im 18/104]
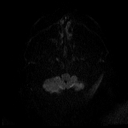
[im 35/104]
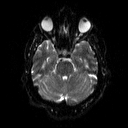
[im 52/104]
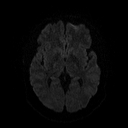
[im 69/104]
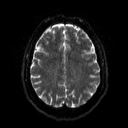
[im 86/104]
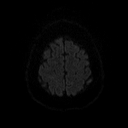
[im 104/104]
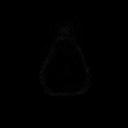

[Series 4: DWI · axial · 3.0mm · 1.80mm/px · z∈[-40,+96]mm · 3 of 52 slices shown (2 of 4)]
[im 1/52]
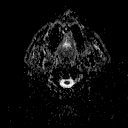
[im 26/52]
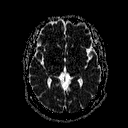
[im 52/52]
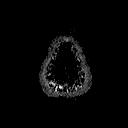

[Series 5: DWI · coronal · 5.0mm · 1.80mm/px · 5 of 72 slices shown (3 of 4)]
[im 1/72]
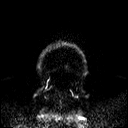
[im 18/72]
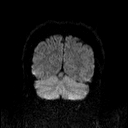
[im 36/72]
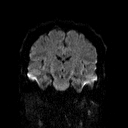
[im 54/72]
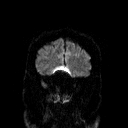
[im 72/72]
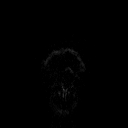

[Series 6: DWI · coronal · 5.0mm · 1.80mm/px · 2 of 36 slices shown (4 of 4)]
[im 1/36]
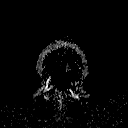
[im 36/36]
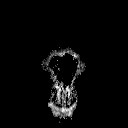

[Series 7: T2 · axial · 5.0mm · 0.60mm/px · 1 of 22 slices shown (1 of 2)]
[im 1/22]
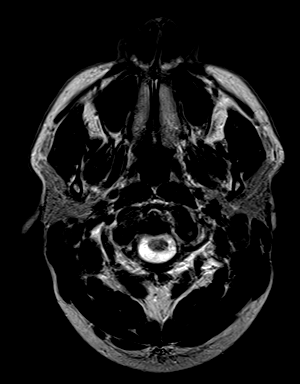

[Series 8: FLAIR · axial · 3.0mm · 0.45mm/px · z∈[-38,+94]mm · 2 of 33 slices shown]
[im 1/33]
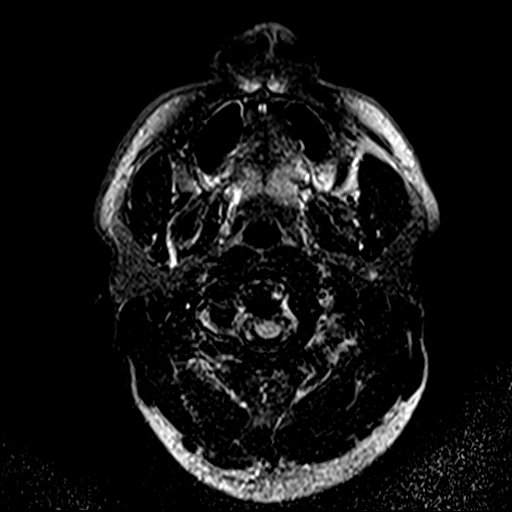
[im 33/33]
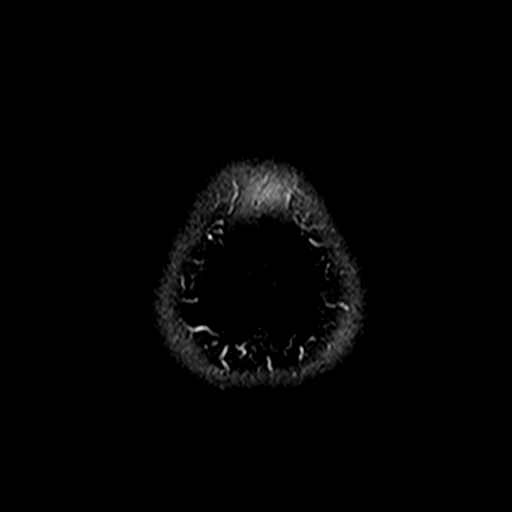

[Series 10: swi_images · axial · 4.0mm · 0.90mm/px · z∈[-35,+89]mm · 2 of 36 slices shown]
[im 1/36]
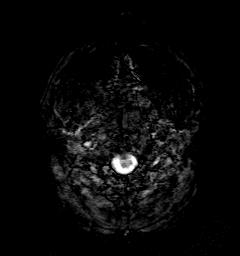
[im 36/36]
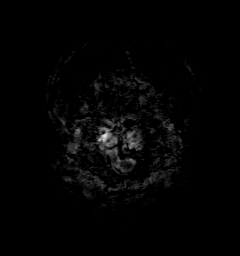

[Series 11: t1_mpr_tra · axial · 1.0mm · 0.75mm/px · z∈[-35,+92]mm · 10 of 144 slices shown]
[im 1/144]
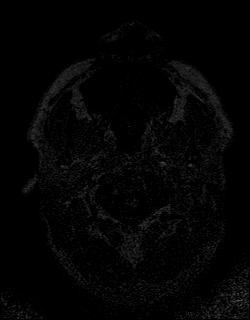
[im 16/144]
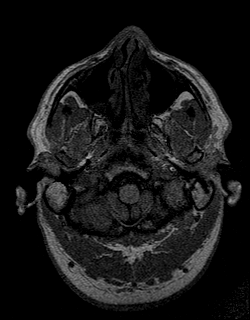
[im 32/144]
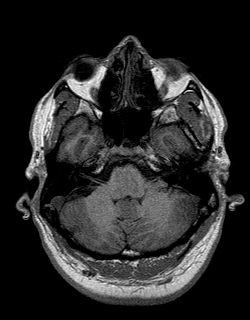
[im 48/144]
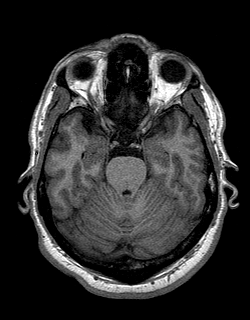
[im 64/144]
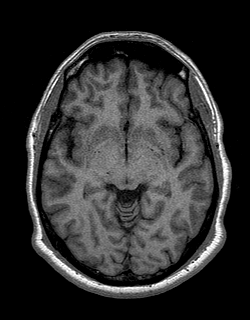
[im 80/144]
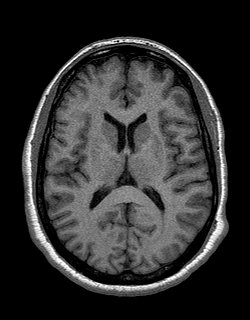
[im 96/144]
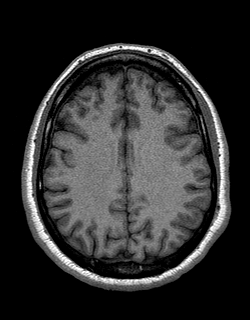
[im 112/144]
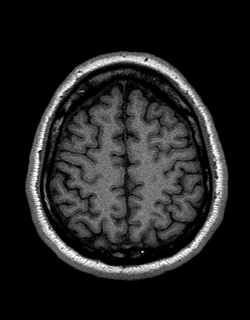
[im 128/144]
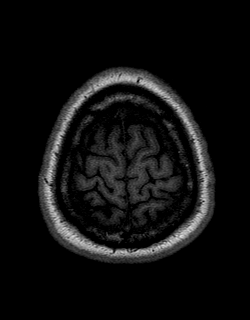
[im 144/144]
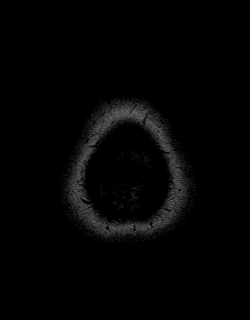

[Series 12: T2 · coronal · 5.0mm · 0.45mm/px · 2 of 28 slices shown (2 of 2)]
[im 1/28]
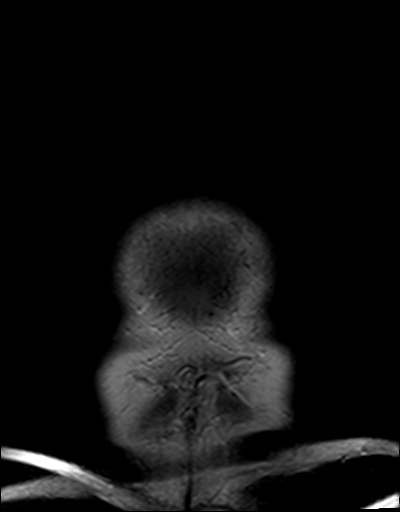
[im 28/28]
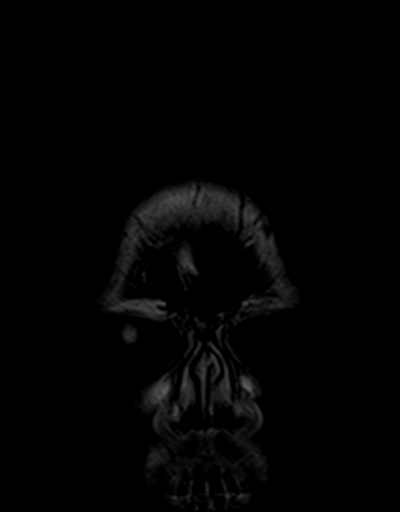

[Series 13: t1_mpr_tra post · axial · 1.0mm · 0.75mm/px · z∈[-35,+92]mm · 10 of 144 slices shown]
[im 1/144]
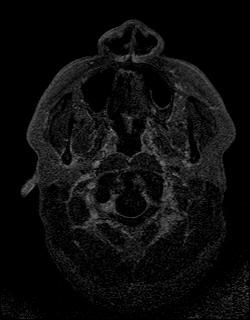
[im 16/144]
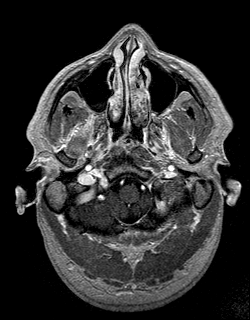
[im 32/144]
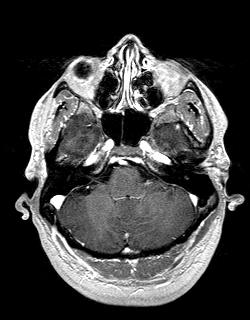
[im 48/144]
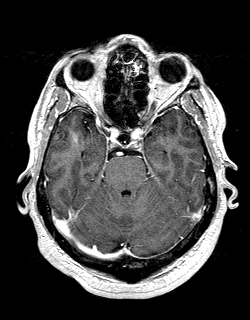
[im 64/144]
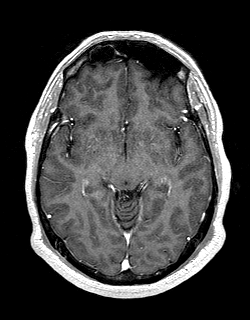
[im 80/144]
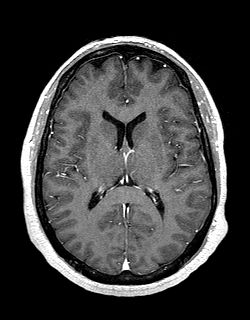
[im 96/144]
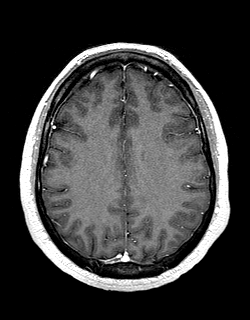
[im 112/144]
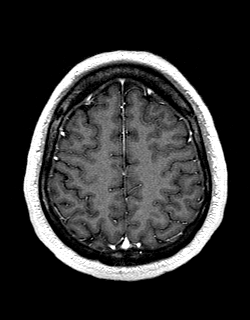
[im 128/144]
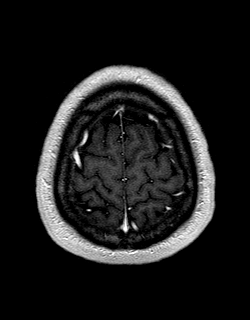
[im 144/144]
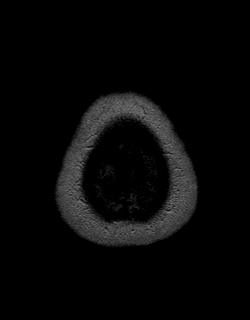

[Series 14: post cor · coronal · 5.0mm · 0.45mm/px · 2 of 28 slices shown]
[im 1/28]
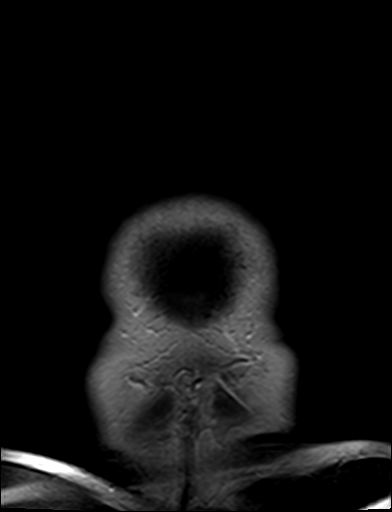
[im 28/28]
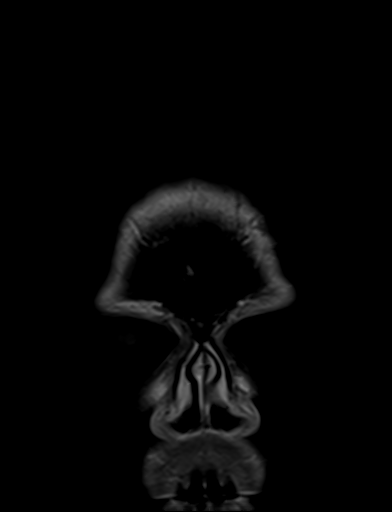

[48 of 48 positions shown; findings below may reference images not displayed]

FINDINGS: Brain:

No age-advanced or lobar predominant atrophy.

No cortical encephalomalacia is identified. No significant cerebral
white matter disease.

There is no acute infarct.

No evidence of an intracranial mass.

No chronic intracranial blood products.

No extra-axial fluid collection.

No midline shift.

No pathologic intracranial enhancement identified.

Vascular: Maintained flow voids within the proximal large arterial
vessels.

Skull and upper cervical spine: No focal suspicious marrow lesion.

Sinuses/Orbits: Visualized orbits show no acute finding. Mild
mucosal thickening within the left frontoethmoidal recess and
bilateral ethmoid air cells. Small mucous retention cysts within the
bilateral sphenoid sinuses. 2.2 cm mucous retention cyst within the
right maxillary sinus.
IMPRESSION: Unremarkable MRI appearance of the brain. No evidence of acute
intracranial abnormality.

Paranasal sinus disease, as described.

## 2023-01-25 ENCOUNTER — Ambulatory Visit (HOSPITAL_BASED_OUTPATIENT_CLINIC_OR_DEPARTMENT_OTHER): Payer: No Typology Code available for payment source

## 2023-01-25 DIAGNOSIS — I351 Nonrheumatic aortic (valve) insufficiency: Secondary | ICD-10-CM

## 2023-01-25 LAB — ECHOCARDIOGRAM COMPLETE
Area-P 1/2: 2.95 cm2
Calc EF: 53.1 %
P 1/2 time: 745 msec
S' Lateral: 4.16 cm
Single Plane A2C EF: 49.5 %
Single Plane A4C EF: 56.3 %

## 2023-01-26 ENCOUNTER — Encounter (HOSPITAL_BASED_OUTPATIENT_CLINIC_OR_DEPARTMENT_OTHER): Payer: Self-pay

## 2023-01-26 NOTE — Telephone Encounter (Signed)
FYI for your reivew

## 2023-02-05 ENCOUNTER — Telehealth (HOSPITAL_BASED_OUTPATIENT_CLINIC_OR_DEPARTMENT_OTHER): Payer: Self-pay

## 2023-02-05 DIAGNOSIS — I351 Nonrheumatic aortic (valve) insufficiency: Secondary | ICD-10-CM

## 2023-02-05 NOTE — Telephone Encounter (Addendum)
Seen by patient Greg Dillon on 02/04/2023 10:28 PM; follow up testing ordered.   ----- Message from Alver Sorrow, NP sent at 02/04/2023  8:51 PM EDT ----- Normal heart pumping function. Mild leaking of mitral valve. Mild to moderate leaking of aortic valve. Mild dilation ascending aorta 43mm. We prevent this from worsening by keeping BP controlled.  Repeat echo in one year for monitoring. Follow up as scheduled 02/09/23.

## 2023-02-09 ENCOUNTER — Ambulatory Visit (HOSPITAL_BASED_OUTPATIENT_CLINIC_OR_DEPARTMENT_OTHER): Payer: No Typology Code available for payment source | Admitting: Cardiology

## 2023-02-09 ENCOUNTER — Encounter (HOSPITAL_BASED_OUTPATIENT_CLINIC_OR_DEPARTMENT_OTHER): Payer: Self-pay | Admitting: Cardiology

## 2023-02-09 VITALS — BP 116/72 | HR 64 | Ht 69.0 in | Wt 222.0 lb

## 2023-02-09 DIAGNOSIS — Z7189 Other specified counseling: Secondary | ICD-10-CM

## 2023-02-09 DIAGNOSIS — I351 Nonrheumatic aortic (valve) insufficiency: Secondary | ICD-10-CM | POA: Diagnosis not present

## 2023-02-09 DIAGNOSIS — Z712 Person consulting for explanation of examination or test findings: Secondary | ICD-10-CM

## 2023-02-09 DIAGNOSIS — E7800 Pure hypercholesterolemia, unspecified: Secondary | ICD-10-CM

## 2023-02-09 DIAGNOSIS — I10 Essential (primary) hypertension: Secondary | ICD-10-CM | POA: Diagnosis not present

## 2023-02-09 NOTE — Progress Notes (Signed)
Cardiology Office Note:    Date:  02/09/2023   ID:  Greg Dillon, DOB 09-19-1965, MRN [id number]  PCP:  Greg Joe, MD  Cardiologist:  Jodelle Red, MD  Referring MD: Greg Joe, MD   CC: follow up  History of Present Illness:    Greg Dillon is a 57 y.o. male with a hx of hypertension, heart murmur, and hyperlipidemia who is seen for follow up today. I initially met him 05/15/22 as a new consult at the request of Greg Joe, MD for the evaluation and management of aortic regurgitation.  No records available from the [place]. Previously followed by [facility]. cMRI 6/023 with EF 50%, ESD 4.6 cm (EDD 6.0), normal aortic root, moderate AR  Today: Doing well overall. Had knee surgery in October, doing well. Trying to lose weight. Remains very physically active, both with weights and walking several miles at a time. No edema, no shortness of breath. Weight is 217 lbs at home, down about 3 lbs in a month.   Reviewed echo today. Stable. EDD 5.14 cm, ESD 4.16 cm. Asc aorta 4.3 cm. BP at home as been more like 117/74, highest 120/78.  Denies chest pain, shortness of breath at rest or with normal exertion. No PND, orthopnea, LE edema or unexpected weight gain. No syncope or palpitations.   Past Medical History:  Diagnosis Date   Heart murmur    Herpes    High cholesterol    Hypertension     Past Surgical History:  Procedure Laterality Date   EYE SURGERY Right    LUMBAR EPIDURAL INJECTION      Current Medications: Current Outpatient Medications on File Prior to Visit  Medication Sig   amLODipine (NORVASC) 10 MG tablet Take 10 mg by mouth daily.   aspirin 81 MG chewable tablet 1 tablet   atorvastatin (LIPITOR) 40 MG tablet Take 40 mg by mouth daily.   busPIRone (BUSPAR) 15 MG tablet TAKE ONE TABLET BY MOUTH THREE TIMES A DAY FOR ANXIETY   cholecalciferol (VITAMIN D3) 25 MCG (1000 UNIT) tablet Take 2,000 Units by mouth daily.   cyclobenzaprine (FLEXERIL) 10 MG tablet     diazepam (VALIUM) 5 MG tablet Take 1 tablet (5 mg total) by mouth 2 (two) times daily.   fluticasone (FLONASE) 50 MCG/ACT nasal spray Place 2 sprays into both nostrils daily.   hydrOXYzine (ATARAX) 10 MG tablet Take 10 mg by mouth 2 (two) times daily as needed.   naltrexone (DEPADE) 50 MG tablet Take 1 tablet by mouth every morning.   ondansetron (ZOFRAN-ODT) 4 MG disintegrating tablet Take 4 mg by mouth every 8 (eight) hours as needed.   oxyCODONE-acetaminophen (PERCOCET/ROXICET) 5-325 MG tablet Take 2 tablets by mouth every 4 (four) hours as needed for severe pain.   sertraline (ZOLOFT) 100 MG tablet TAKE ONE AND ONE-HALF TABLETS BY MOUTH EVERY MORNING AFTER BREAKFAST FOR ANXIETY, PTSD, AND MOOD   SUMAtriptan (IMITREX) 50 MG tablet TAKE ONE TABLET BY MOUTH AS DIRECTED AS NEEDED FOR MIGRAINE HEADACHE (FIRST DOSE AT ONSET OF HEADACHE,THEN REPEAT AFTER 2 HOURS IF NO RELIEF. NO MORE THAN 200 MG PER DAY)   valACYclovir (VALTREX) 500 MG tablet Take 500 mg by mouth daily.   No current facility-administered medications on file prior to visit.     Allergies:   Azithromycin, Losartan, Povidone iodine, and Tessalon [benzonatate]   Social History   Tobacco Use   Smoking status: Some Days    Types: Cigars   Smokeless tobacco: Never  Vaping Use  Vaping Use: Never used  Substance Use Topics   Alcohol use: Not Currently    Comment: was drinking reg. last drink in July   Drug use: No    Family History: family history includes Diabetes Mellitus I in his father; Hyperlipidemia in his father; Hypertension in his mother and sister; Seizures in his brother.  ROS:   Please see the history of present illness.   Additional pertinent ROS otherwise unremarkable.   EKGs/Labs/Other Studies Reviewed:    The following studies were reviewed today: Echo 01/25/23 1. Left ventricular ejection fraction, by estimation, is 50 to 55%. The  left ventricle has low normal function. The left ventricle has no  regional  wall motion abnormalities. The left ventricular internal cavity size was  mildly dilated. Left ventricular  diastolic parameters were normal. The average left ventricular global  longitudinal strain is -15.0 %. The global longitudinal strain is normal.   2. Right ventricular systolic function is normal. The right ventricular  size is normal. There is normal pulmonary artery systolic pressure.   3. Left atrial size was mildly dilated.   4. Right atrial size was mildly dilated.   5. The mitral valve is normal in structure. Mild mitral valve  regurgitation. No evidence of mitral stenosis.   6. The aortic valve is tricuspid. Aortic valve regurgitation is mild to  moderate. No aortic stenosis is present. Aortic regurgitation PHT measures  745 msec.   7. Aortic dilatation noted. There is mild dilatation of the ascending  aorta, measuring 43 mm.   8. The inferior vena cava is normal in size with greater than 50%  respiratory variability, suggesting right atrial pressure of 3 mmHg.   Comparison(s): Prior images unable to be directly viewed, comparison made  by report only. Prior cMRI 6/023 (care everywhere) with EF 50%, moderate  AR with Rvol 23 ml/RF 23%.   Previously followed by [facility]. cMRI 6/023 with EF 50%, ESD 4.6 cm (EDD 6.0), normal aortic root, moderate AR  Echo 09/09/2015: Study Conclusions   - Left ventricle: The cavity size was normal. Wall thickness was    normal. Systolic function was normal. The estimated ejection    fraction was in the range of 50% to 55%. Wall motion was normal;    there were no regional wall motion abnormalities. Left    ventricular diastolic function parameters were normal.  - Aortic valve: There was mild to moderate regurgitation directed    centrally in the LVOT.   EKG:  EKG is personally reviewed.   02/09/23: not ordered today 05/15/22: sinus bradycardia at 58 bpm, LAD   Recent Labs: No results found for requested labs within last 365 days.   Recent Lipid Panel No results found for: "CHOL", "TRIG", "HDL", "CHOLHDL", "VLDL", "LDLCALC", "LDLDIRECT"  Physical Exam:    VS:  BP 116/72   Pulse 64   Ht 5\' 9"  (1.753 m)   Wt 222 lb (100.7 kg)   SpO2 99%   BMI 32.78 kg/m     Wt Readings from Last 3 Encounters:  02/09/23 222 lb (100.7 kg)  11/02/22 219 lb (99.3 kg)  05/15/22 218 lb 12.8 oz (99.2 kg)    GEN: Well nourished, well developed in no acute distress HEENT: Normal, moist mucous membranes NECK: No JVD CARDIAC: regular rhythm, normal S1 and S2, no rubs or gallops. No murmur appreciated. VASCULAR: Radial and DP pulses 2+ bilaterally. No carotid bruits RESPIRATORY:  Clear to auscultation without rales, wheezing or rhonchi  ABDOMEN: Soft, non-tender,  non-distended MUSCULOSKELETAL:  Ambulates independently SKIN: Warm and dry, no edema NEUROLOGIC:  Alert and oriented x 3. No focal neuro deficits noted. PSYCHIATRIC:  Normal affect    ASSESSMENT:    1. Nonrheumatic aortic valve insufficiency   2. Essential hypertension   3. Pure hypercholesterolemia   4. Cardiac risk counseling   5. Counseling on health promotion and disease prevention   6. Encounter to discuss test results     PLAN:    Moderate aortic regurgitation -per [facility] notes, initially diagnosed ~2018 -asymptomatic -last evaluated by MRI 02/2022. Would repeat MRI if symptoms or echo worsens. -echo 01/2023 stable. EDD 5.14 cm, ESD 4.16 cm. Asc aorta 4.3 cm. -repeat one year  Hypercholesterolemia -on atorvastatin 40 mg -no known CAD -on aspirin per patient preference  Hypertension -better on home numbers, improved on recheck -continue amlodipine  Cardiac risk counseling and prevention recommendations: -working on weight loss -recommend heart healthy/Mediterranean diet, with whole grains, fruits, vegetable, fish, lean meats, nuts, and olive oil. Limit salt. -recommend moderate walking, 3-5 times/week for 30-50 minutes each session. Aim for at least  150 minutes.week. Goal should be pace of 3 miles/hours, or walking 1.5 miles in 30 minutes -recommend avoidance of tobacco products. Avoid excess alcohol.  Plan for follow up: After Echo in 01/2024, follow up after.  Jodelle Red, MD, PhD, [facility] [place]  [facility]    Medication Adjustments/Labs and Tests Ordered: Current medicines are reviewed at length with the patient today.  Concerns regarding medicines are outlined above.  No orders of the defined types were placed in this encounter.  No orders of the defined types were placed in this encounter.   Patient Instructions  Medication Instructions:  Your physician recommends that you continue on your current medications as directed. Please refer to the Current Medication list given to you today.  *If you need a refill on your cardiac medications before your next appointment, please call your pharmacy*  Follow-Up: At [facility], you and your health needs are our priority.  As part of our continuing mission to provide you with exceptional heart care, we have created designated Provider Care Teams.  These Care Teams include your primary Cardiologist (physician) and Advanced Practice Providers (APPs -  Physician Assistants and Nurse Practitioners) who all work together to provide you with the care you need, when you need it.  We recommend signing up for the patient portal called "MyChart".  Sign up information is provided on this After Visit Summary.  MyChart is used to connect with patients for Virtual Visits (Telemedicine).  Patients are able to view lab/test results, encounter notes, upcoming appointments, etc.  Non-urgent messages can be sent to your provider as well.   To learn more about what you can do with MyChart, go to [email or web address].    Your next appointment:   1 year(s) after echo has been completed   Provider:   Jodelle Red, MD      Signed, Jodelle Red, MD  PhD 02/09/2023     [facility]

## 2023-02-09 NOTE — Patient Instructions (Signed)
Medication Instructions:  Your physician recommends that you continue on your current medications as directed. Please refer to the Current Medication list given to you today.  *If you need a refill on your cardiac medications before your next appointment, please call your pharmacy*  Follow-Up: At [facility], you and your health needs are our priority.  As part of our continuing mission to provide you with exceptional heart care, we have created designated Provider Care Teams.  These Care Teams include your primary Cardiologist (physician) and Advanced Practice Providers (APPs -  Physician Assistants and Nurse Practitioners) who all work together to provide you with the care you need, when you need it.  We recommend signing up for the patient portal called "MyChart".  Sign up information is provided on this After Visit Summary.  MyChart is used to connect with patients for Virtual Visits (Telemedicine).  Patients are able to view lab/test results, encounter notes, upcoming appointments, etc.  Non-urgent messages can be sent to your provider as well.   To learn more about what you can do with MyChart, go to [email or web address].    Your next appointment:   1 year(s) after echo has been completed   Provider:   Jodelle Red, MD

## 2023-03-14 ENCOUNTER — Encounter: Payer: No Typology Code available for payment source | Attending: Pain Medicine | Admitting: Dietician

## 2023-03-14 ENCOUNTER — Encounter: Payer: Self-pay | Admitting: Dietician

## 2023-03-14 VITALS — Ht 69.0 in | Wt 218.5 lb

## 2023-03-14 DIAGNOSIS — E119 Type 2 diabetes mellitus without complications: Secondary | ICD-10-CM | POA: Insufficient documentation

## 2023-03-14 NOTE — Progress Notes (Signed)
Diabetes Self-Management Education  Visit Type: First/Initial  Appt. Start Time: 0930 Appt. End Time: 1030  03/14/2023  [name], identified by name and date of birth, is a 57 y.o. male with a diagnosis of Diabetes: Type 2.   ASSESSMENT  History includes: heart murmur, HLD, HTN, type 2 diabetes, depression Labs noted: 12/14/22 A1c 6.9%, LDL 134 Medications include: reviewed Supplements: vitamin D3  Pt states he's been wanting to see a dietitian so he can make sure he is doing what he can nutritionally to lose weight, control his diabetes naturally, and live a long and healthy life.  Pt states in the past he has made changes to his diet and exercise and got his A1c to 6.4% but it recently went up to 6.9%.   Pt reports he is very active. He has a home gym with a variety of equipment such as elliptical, benches, dumbbells, rowing machine, etc, and exercises 4-6 days per week for 60-120 minutes.   Pt states he has been following a goal of his to only eat 1.5 meals per day. He states he snacks on pork rinds and cheetos in between.  Pt states he was 230 lb and got down to 196 lb and then picked it back up and is now 218 lb and wants to be 190 lb.   Height 5\' 9"  (1.753 m), weight 218 lb 8 oz (99.1 kg). Body mass index is 32.27 kg/m.   Diabetes Self-Management Education - 03/14/23 0929       Visit Information   Visit Type First/Initial      Initial Visit   Diabetes Type Type 2    Date Diagnosed 2016    Are you currently following a meal plan? No    Are you taking your medications as prescribed? Not on Medications      Health Coping   How would you rate your overall health? Good      Psychosocial Assessment   Patient Belief/Attitude about Diabetes Denial    What is the hardest part about your diabetes right now, causing you the most concern, or is the most worrisome to you about your diabetes?   Making healty food and beverage choices    Self-care barriers None     Self-management support Doctor's office    Other persons present Patient    Patient Concerns Nutrition/Meal planning    Special Needs None    Preferred Learning Style No preference indicated    Learning Readiness Ready    How often do you need to have someone help you when you read instructions, pamphlets, or other written materials from your doctor or pharmacy? 1 - Never    What is the last grade level you completed in school? MBA      Pre-Education Assessment   Patient understands the diabetes disease and treatment process. Needs Instruction    Patient understands incorporating nutritional management into lifestyle. Needs Instruction    Patient undertands incorporating physical activity into lifestyle. Needs Instruction    Patient understands using medications safely. Needs Instruction    Patient understands monitoring blood glucose, interpreting and using results Needs Instruction    Patient understands prevention, detection, and treatment of acute complications. Needs Instruction    Patient understands prevention, detection, and treatment of chronic complications. Needs Instruction    Patient understands how to develop strategies to address psychosocial issues. Needs Instruction    Patient understands how to develop strategies to promote health/change behavior. Needs Instruction  Complications   Last HgB A1C per patient/outside source 6.9 %    How often do you check your blood sugar? 0 times/day (not testing)    Have you had a dilated eye exam in the past 12 months? Yes    Have you had a dental exam in the past 12 months? Yes    Are you checking your feet? Yes    How many days per week are you checking your feet? 7      Dietary Intake   Breakfast yogurt with nuts and peanut butter    Snack (morning) none    Lunch pork skins OR white cheetos OR fritos    Snack (afternoon) nuts    Dinner skips OR chicken and rice and vegetables    Snack (evening) fudge bar OR carb smart ice  cream    Beverage(s) propel, sugar free gatorade, regular gatorade, 2 L water, fairline chocolate milk after workout.      Activity / Exercise   Activity / Exercise Type Moderate (swimming / aerobic walking)    How many days per week do you exercise? 5    How many minutes per day do you exercise? 80    Total minutes per week of exercise 400      Patient Education   Previous Diabetes Education No    Disease Pathophysiology Definition of diabetes, type 1 and 2, and the diagnosis of diabetes;Factors that contribute to the development of diabetes;Explored patient's options for treatment of their diabetes    Healthy Eating Role of diet in the treatment of diabetes and the relationship between the three main macronutrients and blood glucose level;Plate Method;Reviewed blood glucose goals for pre and post meals and how to evaluate the patients' food intake on their blood glucose level.;Meal timing in regards to the patients' current diabetes medication.;Information on hints to eating out and maintain blood glucose control.;Meal options for control of blood glucose level and chronic complications.    Being Active Role of exercise on diabetes management, blood pressure control and cardiac health.;Helped patient identify appropriate exercises in relation to his/her diabetes, diabetes complications and other health issue.;Identified with patient nutritional and/or medication changes necessary with exercise.    Monitoring Identified appropriate SMBG and/or A1C goals.;Daily foot exams;Yearly dilated eye exam    Chronic complications Relationship between chronic complications and blood glucose control;Assessed and discussed foot care and prevention of foot problems;Lipid levels, blood glucose control and heart disease;Identified and discussed with patient  current chronic complications    Diabetes Stress and Support Identified and addressed patients feelings and concerns about diabetes;Worked with patient to  identify barriers to care and solutions;Role of stress on diabetes    Lifestyle and Health Coping Lifestyle issues that need to be addressed for better diabetes care      Individualized Goals (developed by patient)   Nutrition General guidelines for healthy choices and portions discussed    Physical Activity Exercise 5-7 days per week;60 minutes per day    Medications Not Applicable    Monitoring  Not Applicable    Problem Solving Eating Pattern    Reducing Risk examine blood glucose patterns;do foot checks daily;treat hypoglycemia with 15 grams of carbs if blood glucose less than 70mg /dL    Health Coping Ask for help with psychological, social, or emotional issues      Post-Education Assessment   Patient understands the diabetes disease and treatment process. Comprehends key points    Patient understands incorporating nutritional management into lifestyle. Comprehends key points  Patient undertands incorporating physical activity into lifestyle. Comprehends key points    Patient understands using medications safely. Comphrehends key points    Patient understands monitoring blood glucose, interpreting and using results Comprehends key points    Patient understands prevention, detection, and treatment of acute complications. Comprehends key points    Patient understands prevention, detection, and treatment of chronic complications. Comprehends key points    Patient understands how to develop strategies to address psychosocial issues. Comprehends key points    Patient understands how to develop strategies to promote health/change behavior. Comprehends key points      Outcomes   Expected Outcomes Demonstrated interest in learning. Expect positive outcomes    Future DMSE 2 months    Program Status Not Completed             Individualized Plan for Diabetes Self-Management Training:   Learning Objective:  Patient will have a greater understanding of diabetes self-management. Patient  education plan is to attend individual and/or group sessions per assessed needs and concerns.   Plan:   Patient Instructions  Continue current exercise regimen.  Goal: Eat 3 meals per day. (At meals, aim to include 1/2 plate non-starchy vegetables, 1/4 plate protein, and 1/4 plate complex carbs.)  Goal: aim to drink 3L of water daily.   Goal: During workouts only have 1/2 of a gatorade.   When snacking, aim to include a complex carb and protein.    Expected Outcomes:  Demonstrated interest in learning. Expect positive outcomes  Education material provided: ADA - How to Thrive: A Guide for Your Journey with Diabetes, Meal plan card, and Snack sheet  If problems or questions, patient to contact team via:  Phone  Future DSME appointment: 2 months

## 2023-03-14 NOTE — Patient Instructions (Addendum)
Continue current exercise regimen.  Goal: Eat 3 meals per day. (At meals, aim to include 1/2 plate non-starchy vegetables, 1/4 plate protein, and 1/4 plate complex carbs.)  Goal: aim to drink 3L of water daily.   Goal: During workouts only have 1/2 of a gatorade.   When snacking, aim to include a complex carb and protein.

## 2023-03-20 ENCOUNTER — Other Ambulatory Visit (HOSPITAL_COMMUNITY): Payer: Self-pay | Admitting: *Deleted

## 2023-03-20 ENCOUNTER — Telehealth (HOSPITAL_COMMUNITY): Payer: Self-pay | Admitting: *Deleted

## 2023-03-20 DIAGNOSIS — R1310 Dysphagia, unspecified: Secondary | ICD-10-CM

## 2023-03-20 NOTE — Telephone Encounter (Signed)
 Attempted to contact patient to schedule OP MBS. Left VM. RKEEL 

## 2023-03-28 ENCOUNTER — Ambulatory Visit (HOSPITAL_BASED_OUTPATIENT_CLINIC_OR_DEPARTMENT_OTHER): Payer: No Typology Code available for payment source | Admitting: Cardiology

## 2023-03-29 DIAGNOSIS — F4310 Post-traumatic stress disorder, unspecified: Secondary | ICD-10-CM | POA: Diagnosis not present

## 2023-03-29 DIAGNOSIS — E782 Mixed hyperlipidemia: Secondary | ICD-10-CM | POA: Diagnosis not present

## 2023-03-29 DIAGNOSIS — I10 Essential (primary) hypertension: Secondary | ICD-10-CM | POA: Diagnosis not present

## 2023-03-29 DIAGNOSIS — E1169 Type 2 diabetes mellitus with other specified complication: Secondary | ICD-10-CM | POA: Diagnosis not present

## 2023-03-30 ENCOUNTER — Ambulatory Visit (HOSPITAL_COMMUNITY)
Admission: RE | Admit: 2023-03-30 | Discharge: 2023-03-30 | Disposition: A | Discharge status: Home / Self Care | Payer: No Typology Code available for payment source | Source: Ambulatory Visit | Attending: Family Medicine | Admitting: Family Medicine

## 2023-03-30 ENCOUNTER — Ambulatory Visit (HOSPITAL_COMMUNITY)
Admission: RE | Admit: 2023-03-30 | Discharge: 2023-03-30 | Disposition: A | Discharge status: Home / Self Care | Payer: No Typology Code available for payment source | Source: Ambulatory Visit

## 2023-03-30 DIAGNOSIS — R1310 Dysphagia, unspecified: Secondary | ICD-10-CM

## 2023-03-30 DIAGNOSIS — R059 Cough, unspecified: Secondary | ICD-10-CM | POA: Diagnosis not present

## 2023-03-30 DIAGNOSIS — R1314 Dysphagia, pharyngoesophageal phase: Secondary | ICD-10-CM | POA: Diagnosis not present

## 2023-03-30 NOTE — Therapy (Signed)
Modified Barium Swallow Study  Patient Details  Name: Greg Dillon MRN: [id number] Date of Birth: 02/01/1966  Today's Date: 03/30/2023  HPI/PMH: HPI: Greg Dillon is a 57 yo male referred for swallow evaluation by [facility] provider.  He has been having intermittent episodes of coughing/choking with PO intake which include changes to vocal quality.  Pt reports no recent hx pna, denies feeling of stasis. These episodes happen more often with food than with liquid and can last up to 10 minutes.   Clinical Impression: Clinical Impression: Pt presents with functional oropharyngeal swallowing with swallow initation at the vallecula.  There was trace, transient penetration of thin liquid x1 with consecutive cup sips.  There was no other penetration or aspiration with any consistencies trialed including serial straw sips of thin liquid.  During pill simulation there was esophageal stasis of tablet.  Pt was sensaste to this and reports it is consistent with the episodes he has been having.  Thin liquid wash did not not clear tablet from esophagus, but following with a puree bolus did help push tablet through esophagus.  Pt may want to take medications with puree and may want to have soft foods available to assist with clearance should solids become lodged during meals. Suspect pt's coughing and vocal changes represent a response to vagal stimulation by esophageal stasis.  Pt's dysphagia appears to be primarily esophageal and he would benefit from further evaluation of esophageal function which may include esophagram, GI referral, and/or EGD.  Recommend continuing regular texture diet and thin liquids.  Provided education regarding esophageal dysphagia precautions and strategies to help with symptom management.  Factors that may increase risk of adverse event in presence of aspiration Rubye Oaks & Clearance Coots 2021): No data recorded  Recommendations/Plan: Swallowing Evaluation Recommendations Swallowing Evaluation  Recommendations Recommendations: PO diet PO Diet Recommendation: Regular; Thin liquids (Level 0) Liquid Administration via: Straw; Cup Medication Administration: Whole meds with puree Supervision: Patient able to self-feed Swallowing strategies  : Follow solids with liquids (or puress/soft solids) Postural changes: Stay upright 30-60 min after meals; Position pt fully upright for meals Oral care recommendations: Oral care BID (2x/day) Recommended consults: Consider GI consultation; Consider esophageal assessment    Treatment Plan Treatment Plan Treatment recommendations: No treatment recommended at this time Follow-up recommendations: No SLP follow up     Recommendations Recommendations for follow up therapy are one component of a multi-disciplinary discharge planning process, led by the attending physician.  Recommendations may be updated based on patient status, additional functional criteria and insurance authorization.  Assessment: Orofacial Exam: Orofacial Exam Oral Cavity: Oral Hygiene: WFL Oral Cavity - Dentition: Adequate natural dentition Orofacial Anatomy: WFL Oral Motor/Sensory Function: WFL    Anatomy:  Anatomy: Suspected cervical osteophytes (possible at C5-6, does not impede bolus flow)   Boluses Administered: Boluses Administered Boluses Administered: Thin liquids (Level 0); Mildly thick liquids (Level 2, nectar thick); Moderately thick liquids (Level 3, honey thick); Puree; Solid     Oral Impairment Domain: Oral Impairment Domain Lip Closure: No labial escape Tongue control during bolus hold: Cohesive bolus between tongue to palatal seal Bolus preparation/mastication: Timely and efficient chewing and mashing Bolus transport/lingual motion: Brisk tongue motion Oral residue: Complete oral clearance Location of oral residue : N/A Initiation of pharyngeal swallow : Valleculae; Pyriform sinuses     Pharyngeal Impairment Domain: Pharyngeal Impairment  Domain Soft palate elevation: No bolus between soft palate (SP)/pharyngeal wall (PW) Laryngeal elevation: Complete superior movement of thyroid cartilage with complete approximation of arytenoids to epiglottic  petiole Anterior hyoid excursion: Complete anterior movement Epiglottic movement: Complete inversion Laryngeal vestibule closure: Incomplete, narrow column air/contrast in laryngeal vestibule Pharyngeal stripping wave : Present - complete Pharyngeal contraction (A/P view only): N/A Pharyngoesophageal segment opening: Complete distension and complete duration, no obstruction of flow Tongue base retraction: Trace column of contrast or air between tongue base and PPW Pharyngeal residue: Trace residue within or on pharyngeal structures Location of pharyngeal residue: Valleculae     Esophageal Impairment Domain: Esophageal Impairment Domain Esophageal clearance upright position: Esophageal retention with retrograde flow below pharyngoesophageal segment (PES)    Pill: Pill Consistency administered: Thin liquids (Level 0); Puree Thin liquids (Level 0): Impaired (see clinical impressions) (Stasis) Puree: WFL    Penetration/Aspiration Scale Score: Penetration/Aspiration Scale Score 1.  Material does not enter airway: Mildly thick liquids (Level 2, nectar thick); Moderately thick liquids (Level 3, honey thick); Puree; Thin liquids (Level 0); Solid; Pill 2.  Material enters airway, remains ABOVE vocal cords then ejected out: Thin liquids (Level 0)    Compensatory Strategies: Compensatory Strategies Compensatory strategies: Yes Liquid wash: Ineffective (for clearing esophageal stasis) Other(comment): Effective (clearing esophageal stasis)       General Information: Caregiver present: Yes (Wife Tammy)   Diet Prior to this Study: Regular; Thin liquids (Level 0)    Temperature : Normal    Respiratory Status: WFL    Supplemental O2: None (Room air)    History of Recent  Intubation: No   Behavior/Cognition: Cooperative; Alert; Pleasant mood  Self-Feeding Abilities: Able to self-feed  Baseline vocal quality/speech: Normal  Volitional Cough: Able to elicit  Volitional Swallow: Able to elicit  Exam Limitations: No limitations   Goal Planning: No data recorded No data recorded No data recorded No data recorded No data recorded  Pain: Pain Assessment Pain Assessment: Faces Pain Score: 0    End of Session: Start Time:SLP Start Time (ACUTE ONLY): 1123  Stop Time: SLP Stop Time (ACUTE ONLY): 1140  Time Calculation:SLP Time Calculation (min) (ACUTE ONLY): 17 min  Charges: SLP Evaluations $ SLP Speech Visit: 1 Visit  SLP Evaluations $Outpatient MBS Swallow: 1 Procedure   SLP visit diagnosis: SLP Visit Diagnosis: Dysphagia, pharyngoesophageal phase (R13.14)    Past Medical History:  Past Medical History:  Diagnosis Date   Heart murmur    Herpes    High cholesterol    Hypertension    Past Surgical History:  Past Surgical History:  Procedure Laterality Date   EYE SURGERY Right    LUMBAR EPIDURAL INJECTION      Kerrie Pleasure, MA, CCC-SLP Acute Rehabilitation Services Office: [phone] 03/30/2023, 12:48 PM

## 2023-05-09 ENCOUNTER — Ambulatory Visit (INDEPENDENT_AMBULATORY_CARE_PROVIDER_SITE_OTHER): Payer: No Typology Code available for payment source | Admitting: Physician Assistant

## 2023-05-09 ENCOUNTER — Telehealth: Payer: Self-pay | Admitting: Physician Assistant

## 2023-05-09 DIAGNOSIS — G8929 Other chronic pain: Secondary | ICD-10-CM

## 2023-05-09 DIAGNOSIS — R519 Headache, unspecified: Secondary | ICD-10-CM

## 2023-05-09 NOTE — Telephone Encounter (Signed)
Pt called and stated he needed to cancel is appointment today because his visit was not covered by [employer]. [facility] told him, nurse needed to call and request service form to get coverage.

## 2023-05-09 NOTE — Telephone Encounter (Signed)
Will send to [facility]

## 2023-05-09 NOTE — Progress Notes (Signed)
Assessment/Plan:   Chronic Non Intractable Headaches    For preventative management, None  For abortive therapy, continue Sumatriptan 50 mg at the first onset of headaches, can take another dose 2 h prn, max 2 a day  as per PCP   Limit use of pain relievers to no more than 2 days out of week to prevent risk of rebound or medication-overuse headache.  Keep headache diary  Exercise, hydration, caffeine cessation, sleep hygiene, monitor for and avoid triggers Alcohol cessation is recommended   Consider:  magnesium citrate 400mg  daily, riboflavin 400mg  daily, and coenzyme Q10 100mg  three times daily Follow up 6 months     Subjective:   This patient is here alone.  Previous records as well as any outside records available were reviewed prior to todays visit.  He was last seen on 11/02/2022.   Onset: started at age 28-18, in College had some episodes and but were never treated with medicines.  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 Since then, when internalizing issues, his HA is worse.  Sometimes, it interferes with comprehension.  Of note, in the recent past the patient had been evaluated for memory issues, yielding completely normal evaluation and imaging. His memory is better since he retired and has less stressed, he is making an effort to "organize the thoughts" Quality: 8-10  Intensity: Sharp, like a knife  Location: bandlike in the frontal area and a burning sensation in the back of the head . Sometimes he feels "behind the eyeballs " Duration: 1 hour Frequency:  "about 3  times a month and nothing for a while" ."they come in blocks"  Associated symptoms: sometimes nausea, no vomiting.  Aura: Endorsed. " Lately no stars"  Activity: In the winter, I am not as active, so maybe if I am in the computer too long may trigger. TV does not bother me  Aggravating factors: now that I am not working I have less stress" Relieving factors:  lies down, puts a towel on the forehead, light and sounds may make it worse . Working out is relaxing to him   Current abortive medications: Sumatriptan  50 mg at the first onset of HA, max 4 in a day,( last in Dec 2023 ).  Tylenol as needed Current prophylactic medications: None  Past abortive medications:  BC powder 2-3 a week , Excedrin, Goody Powder . Past prophylactic medications:   Family history: Negative for migraines. Smoker: cigars  "Once every 4 months"  Alcohol: not recently. Stopped in July ," taking a medicine for it. Taking one beer and shot , on the weekend or every other weekend ". I am taking a meditation and tai chi class". Caffeine: 16-32  a day of coffee .   Sleep:  better since not working, he is on disability because he could not leave the meetings as before. No more than 6-7  hours a night.  MRI brain 11/2021 personally reviewed unremarkable MRI appearance of the brain. No evidence of acute intracranial abnormality.     Initial visit 04/20/2022 Onset: started at age 12-18, in College had some episodes and but were never treated with medicines.  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 Since then, when internalizing issues, his HA is worse.  Sometimes, it interferes with comprehension.  Of note, in the recent past the patient had been evaluated for memory issues, yielding completely  normal evaluation and imaging. Quality: 8-10  Intensity: Sharp, like a knife  Location: bandlike in the frontal area and a burning sensation in the back of the head . Sometimes he feels "behind the eyeballs " Duration: 1 hour Frequency: 3 times a month  Associated symptoms: sometimes nausea, no vomiting.  Aura: Endorsed.  "some stars" minutes before the headache  Activity: washing the car or mowing the lawn may trigger it as well Aggravating factors: stress and deadlines can trigger it to Relieving factors: lies down, puts a towel on  the forehead, light and sounds may make it worse . Working out is relaxing to him  Current abortive medications: Sumatriptan  50 mg at the first onset of HA, max 4 in a day.  Tylenol as needed Current prophylactic medications: None   Past abortive medications:  BC powder 2-3 a week , Excedrin, Goody Powder . Past prophylactic medications:    Family history: Negative for migraines. Smoker: cigars  3-4 /y  Alcohol: not recently. Stopped in July    Caffeine:  12-24 oz a day . In the winter 36-48 oz coffee Sleep:  better since not working, he is on disability because he could not leave the meetings as before. No more than 6 hours a night.      Past Medical History:  Diagnosis Date   Heart murmur    Herpes    High cholesterol    Hypertension      Past Surgical History:  Procedure Laterality Date   EYE SURGERY Right    LUMBAR EPIDURAL INJECTION       PREVIOUS MEDICATIONS:   CURRENT MEDICATIONS:  Outpatient Encounter Medications as of 05/09/2023  Medication Sig   amLODipine (NORVASC) 10 MG tablet Take 10 mg by mouth daily.   aspirin 81 MG chewable tablet 1 tablet   atorvastatin (LIPITOR) 40 MG tablet Take 40 mg by mouth daily.   busPIRone (BUSPAR) 15 MG tablet TAKE ONE TABLET BY MOUTH THREE TIMES A DAY FOR ANXIETY   cholecalciferol (VITAMIN D3) 25 MCG (1000 UNIT) tablet Take 2,000 Units by mouth daily.   cyclobenzaprine (FLEXERIL) 10 MG tablet    diazepam (VALIUM) 5 MG tablet Take 1 tablet (5 mg total) by mouth 2 (two) times daily.   fluticasone (FLONASE) 50 MCG/ACT nasal spray Place 2 sprays into both nostrils daily.   hydrOXYzine (ATARAX) 10 MG tablet Take 10 mg by mouth 2 (two) times daily as needed.   naltrexone (DEPADE) 50 MG tablet Take 1 tablet by mouth every morning.   ondansetron (ZOFRAN-ODT) 4 MG disintegrating tablet Take 4 mg by mouth every 8 (eight) hours as needed.   oxyCODONE-acetaminophen (PERCOCET/ROXICET) 5-325 MG tablet Take 2 tablets by mouth every 4 (four)  hours as needed for severe pain.   sertraline (ZOLOFT) 100 MG tablet TAKE ONE AND ONE-HALF TABLETS BY MOUTH EVERY MORNING AFTER BREAKFAST FOR ANXIETY, PTSD, AND MOOD   SUMAtriptan (IMITREX) 50 MG tablet TAKE ONE TABLET BY MOUTH AS DIRECTED AS NEEDED FOR MIGRAINE HEADACHE (FIRST DOSE AT ONSET OF HEADACHE,THEN REPEAT AFTER 2 HOURS IF NO RELIEF. NO MORE THAN 200 MG PER DAY)   valACYclovir (VALTREX) 500 MG tablet Take 500 mg by mouth daily.   No facility-administered encounter medications on file as of 05/09/2023.     Objective:     PHYSICAL EXAMINATION:    VITALS:   There were no vitals filed for this visit.    GEN:  The patient appears stated age and is in NAD.  HEENT:  Normocephalic, atraumatic.   Neurological examination:  General: No acute distress.  Patient appears well-groomed.   Head:  Normocephalic/atraumatic Eyes:  fundi examined but not visualized Neck: supple, no paraspinal tenderness, full range of motion Back: No paraspinal tenderness Heart: regular rate and rhythm Lungs: Clear to auscultation bilaterally. Vascular: No carotid bruits. Neurological Exam: Mental status: alert and oriented to person, place, and time, recent and remote memory intact, fund of knowledge intact, attention and concentration intact, speech fluent and not dysarthric, language intact. Cranial nerves: CN I: not tested CN II: pupils equal, round and reactive to light, visual fields intact CN III, IV, VI:  full range of motion, no nystagmus, no ptosis CN V: facial sensation intact. CN VII: upper and lower face symmetric CN VIII: hearing intact CN IX, X: gag intact, uvula midline CN XI: sternocleidomastoid and trapezius muscles intact CN XII: tongue midline Bulk & Tone: normal, no fasciculations. Motor:  muscle strength 5/5 throughout Sensation:  Pinprick, temperature and vibratory sensation intact. Deep Tendon Reflexes:  2+ throughout,  toes downgoing.   Finger to nose testing:  Without  dysmetria.   Heel to shin:  Without dysmetria.   Gait:  Normal station and stride.  Romberg negative.      11/09/2021    8:00 AM  Montreal Cognitive Assessment   Visuospatial/ Executive (0/5) 4  Naming (0/3) 3  Attention: Read list of digits (0/2) 2  Attention: Read list of letters (0/1) 1  Attention: Serial 7 subtraction starting at 100 (0/3) 3  Language: Repeat phrase (0/2) 2  Language : Fluency (0/1) 1  Abstraction (0/2) 1  Delayed Recall (0/5) 2  Orientation (0/6) 6  Total 25        No data to display            Thank you for allowing [place] the opportunity to participate in the care of this nice patient. Please do not hesitate to contact us for any questions or concerns.   Total time spent on today's visit was 60 minutes dedicated to this patient today, preparing to see patient, examining the patient, ordering tests and/or medications and counseling the patient, documenting clinical information in the EHR or other health record, independently interpreting results and communicating results to the patient/family, discussing treatment and goals, answering patient's questions and coordinating care.  Cc:  Tally Joe, MD  Marlowe Kays 05/09/2023 6:55 AM

## 2023-05-11 ENCOUNTER — Telehealth: Payer: Self-pay | Admitting: Physician Assistant

## 2023-05-11 NOTE — Telephone Encounter (Signed)
Last ov notes faxed over to [facility] at the [place]. Unable to Email PHI.

## 2023-05-11 NOTE — Telephone Encounter (Signed)
Mary from the [place] called and LM. She needs notes regarding the referral that was sent over. She would like them to be emailed. Any questions you may call her

## 2023-05-23 DIAGNOSIS — Z83719 Family history of colon polyps, unspecified: Secondary | ICD-10-CM | POA: Diagnosis not present

## 2023-05-23 DIAGNOSIS — Z01818 Encounter for other preprocedural examination: Secondary | ICD-10-CM | POA: Diagnosis not present

## 2023-05-23 DIAGNOSIS — R1310 Dysphagia, unspecified: Secondary | ICD-10-CM | POA: Diagnosis not present

## 2023-05-23 DIAGNOSIS — Z1211 Encounter for screening for malignant neoplasm of colon: Secondary | ICD-10-CM | POA: Diagnosis not present

## 2023-05-24 ENCOUNTER — Ambulatory Visit: Payer: No Typology Code available for payment source | Admitting: Dietician

## 2023-05-30 ENCOUNTER — Ambulatory Visit: Payer: No Typology Code available for payment source | Admitting: Physician Assistant

## 2023-05-30 DIAGNOSIS — Z029 Encounter for administrative examinations, unspecified: Secondary | ICD-10-CM

## 2023-05-31 ENCOUNTER — Encounter: Payer: Self-pay | Admitting: Physician Assistant

## 2023-06-08 ENCOUNTER — Encounter: Payer: Self-pay | Admitting: Dietician

## 2023-06-08 ENCOUNTER — Encounter: Payer: No Typology Code available for payment source | Attending: Pain Medicine | Admitting: Dietician

## 2023-06-08 VITALS — Wt 216.0 lb

## 2023-06-08 DIAGNOSIS — E119 Type 2 diabetes mellitus without complications: Secondary | ICD-10-CM | POA: Diagnosis present

## 2023-06-08 NOTE — Progress Notes (Signed)
Diabetes Self-Management Education  Visit Type: Follow-up  Appt. Start Time: 0735 Appt. End Time: 0800  06/08/2023  Greg Dillon, identified by name and date of birth, is a 57 y.o. male with a diagnosis of Diabetes: Type 2.   ASSESSMENT  History includes: heart murmur, HLD, HTN, type 2 diabetes, depression Labs noted: 03/29/23 A1c 6.3%, LDL 134 Medications include: reviewed Supplements: vitamin D3  Pt states things have been going well. He states he traveled a lot in September so he was eating out more but feels that he remained disciplined when at restaurants and also utilized the hotel gym 1-2 times.   Pt with 2.5 lb weight loss since previous visit.   Pt states since previous visit he participated in a golf tournament, has switched from regular gatorade to gatorade zero, and has noticed his clothes are getting looser.   Pt states his last A1c was 6.3 in July down from 6.9 in April.   Previous Goals: (continue all) Continue current exercise regimen.   Goal: Eat 3 meals per day. (At meals, aim to include 1/2 plate non-starchy vegetables, 1/4 plate protein, and 1/4 plate complex carbs.) - goal met, continue!   Goal: aim to drink 3L of water daily. - Goal not met, pt drinking at least 2L, continue!   Goal: During workouts only have 1/2 of a gatorade. - goal met, continue.  Weight 216 lb (98 kg). Body mass index is 31.9 kg/m.   Diabetes Self-Management Education - 06/08/23 0732       Visit Information   Visit Type Follow-up      Initial Visit   Diabetes Type Type 2    Are you taking your medications as prescribed? Yes      Health Coping   How would you rate your overall health? Good      Psychosocial Assessment   Patient Belief/Attitude about Diabetes Motivated to manage diabetes    What is the hardest part about your diabetes right now, causing you the most concern, or is the most worrisome to you about your diabetes?   Making healty food and beverage  choices    Self-care barriers None    Self-management support Doctor's office    Other persons present Patient    Patient Concerns Nutrition/Meal planning    Special Needs None    Preferred Learning Style No preference indicated    Learning Readiness Ready    How often do you need to have someone help you when you read instructions, pamphlets, or other written materials from your doctor or pharmacy? 1 - Never    What is the last grade level you completed in school? MBA      Pre-Education Assessment   Patient understands the diabetes disease and treatment process. Needs Review    Patient understands incorporating nutritional management into lifestyle. Needs Review    Patient undertands incorporating physical activity into lifestyle. Needs Review    Patient understands using medications safely. Needs Review    Patient understands monitoring blood glucose, interpreting and using results Needs Review    Patient understands prevention, detection, and treatment of acute complications. Needs Review    Patient understands prevention, detection, and treatment of chronic complications. Needs Review    Patient understands how to develop strategies to address psychosocial issues. Needs Review    Patient understands how to develop strategies to promote health/change behavior. Needs Review      Complications   Last HgB A1C per patient/outside source 6.3 %  How often do you check your blood sugar? 0 times/day (not testing)      Dietary Intake   Breakfast PBJ OR grits and eggs    Snack (morning) none    Lunch broccoli rice cheese casserole    Snack (afternoon) dark choc peanut butter cup    Dinner chicken with potatoes and vegetables    Snack (evening) none    Beverage(s) water, coffee      Activity / Exercise   Activity / Exercise Type Moderate (swimming / aerobic walking)    How many days per week do you exercise? 7    How many minutes per day do you exercise? 30    Total minutes per week  of exercise 210      Patient Education   Previous Diabetes Education Yes (please comment)    Disease Pathophysiology Explored patient's options for treatment of their diabetes    Healthy Eating Role of diet in the treatment of diabetes and the relationship between the three main macronutrients and blood glucose level;Meal timing in regards to the patients' current diabetes medication.;Meal options for control of blood glucose level and chronic complications.    Being Active Role of exercise on diabetes management, blood pressure control and cardiac health.    Chronic complications Relationship between chronic complications and blood glucose control    Diabetes Stress and Support Identified and addressed patients feelings and concerns about diabetes    Lifestyle and Health Coping Lifestyle issues that need to be addressed for better diabetes care      Individualized Goals (developed by patient)   Nutrition General guidelines for healthy choices and portions discussed    Physical Activity Exercise 5-7 days per week;30 minutes per day    Medications Not Applicable    Monitoring  Not Applicable    Problem Solving Eating Pattern    Reducing Risk examine blood glucose patterns    Health Coping Ask for help with psychological, social, or emotional issues      Patient Self-Evaluation of Goals - Patient rates self as meeting previously set goals (% of time)   Nutrition 50 - 75 % (half of the time)    Physical Activity >75% (most of the time)    Medications Not Applicable    Monitoring Not Applicable    Problem Solving and behavior change strategies  50 - 75 % (half of the time)    Reducing Risk (treating acute and chronic complications) >75% (most of the time)    Health Coping >75% (most of the time)      Post-Education Assessment   Patient understands the diabetes disease and treatment process. Comprehends key points    Patient understands incorporating nutritional management into lifestyle.  Demonstrates understanding / competency    Patient undertands incorporating physical activity into lifestyle. Demonstrates understanding / competency    Patient understands using medications safely. N/A    Patient understands monitoring blood glucose, interpreting and using results N/A    Patient understands prevention, detection, and treatment of acute complications. Comprehends key points    Patient understands prevention, detection, and treatment of chronic complications. Comprehends key points    Patient understands how to develop strategies to address psychosocial issues. Demonstrates understanding / competency    Patient understands how to develop strategies to promote health/change behavior. Comprehends key points      Outcomes   Expected Outcomes Demonstrated interest in learning. Expect positive outcomes    Future DMSE PRN    Program Status Completed  Subsequent Visit   Since your last visit have you continued or begun to take your medications as prescribed? Yes             Individualized Plan for Diabetes Self-Management Training:   Learning Objective:  Patient will have a greater understanding of diabetes self-management. Patient education plan is to attend individual and/or group sessions per assessed needs and concerns.   Plan:   Patient Instructions  Previous Goals: (continue all) Continue current exercise regimen.   Goal: Eat 3 meals per day. (At meals, aim to include 1/2 plate non-starchy vegetables, 1/4 plate protein, and 1/4 plate complex carbs.) - goal met, continue!   Goal: aim to drink 3L of water daily. - Goal not met, pt drinking at least 2L, continue!   Goal: During workouts only have 1/2 of a gatorade. - goal met, continue.  Expected Outcomes:  Demonstrated interest in learning. Expect positive outcomes  Education material provided: No handouts provided on this follow up  If problems or questions, patient to contact team via:  Phone  Future  DSME appointment: PRN

## 2023-06-08 NOTE — Patient Instructions (Signed)
Previous Goals: (continue all) Continue current exercise regimen.   Goal: Eat 3 meals per day. (At meals, aim to include 1/2 plate non-starchy vegetables, 1/4 plate protein, and 1/4 plate complex carbs.) - goal met, continue!   Goal: aim to drink 3L of water daily. - Goal not met, pt drinking at least 2L, continue!   Goal: During workouts only have 1/2 of a gatorade. - goal met, continue.

## 2023-06-14 ENCOUNTER — Ambulatory Visit: Payer: No Typology Code available for payment source | Admitting: Dietician

## 2023-06-21 ENCOUNTER — Ambulatory Visit (INDEPENDENT_AMBULATORY_CARE_PROVIDER_SITE_OTHER): Payer: No Typology Code available for payment source | Admitting: Physician Assistant

## 2023-06-21 ENCOUNTER — Encounter: Payer: Self-pay | Admitting: Physician Assistant

## 2023-06-21 VITALS — BP 139/83 | HR 79 | Resp 20 | Ht 69.0 in | Wt 217.0 lb

## 2023-06-21 DIAGNOSIS — G8929 Other chronic pain: Secondary | ICD-10-CM | POA: Diagnosis not present

## 2023-06-21 DIAGNOSIS — R519 Headache, unspecified: Secondary | ICD-10-CM

## 2023-06-21 MED ORDER — SUMATRIPTAN SUCCINATE 50 MG PO TABS: ORAL_TABLET | ORAL | 10 refills | Status: DC

## 2023-06-21 NOTE — Progress Notes (Signed)
Assessment/Plan:   Chronic Non Intractable Headaches    For preventative management, None  For abortive therapy, continue Sumatriptan 50 mg at the first onset of headaches, can take another dose 2 h prn, max 2 a day  as per PCP   Limit use of pain relievers to no more than 2 days out of week to prevent risk of rebound or medication-overuse headache.  Keep headache diary  Exercise, hydration, caffeine cessation, sleep hygiene, monitor for and avoid triggers Alcohol cessation is recommended   Consider:  magnesium citrate 400mg  daily, riboflavin 400mg  daily, and coenzyme Q10 100mg  three times daily Follow up 6 months     Subjective:   This patient is here alone  Previous records as well as any outside records available were reviewed prior to todays visit.      Onset: started at age 48-18, in College had some episodes and but were never treated with medicines.  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 Since then, when internalizing issues, his HA is worse.  Sometimes, it interferes with comprehension.  Of note, in the recent past the patient had been evaluated for memory issues, yielding completely normal evaluation and imaging. His memory is better since he retired and has less stressed, he is making an effort to "organize the thoughts". He is learning routines and tries to learn "the new normal" Quality: 8/10 Intensity: Sharp, like a knife or constant ache, pounding sometimes.   Location: bandlike in the frontal area and a burning sensation, "stings" in the back of the head . Sometimes he feels "behind the eyeballs " Duration: 1 hour    Frequency:  "about 3  times a month and nothing for a while" ."they come in blocks" "2 episodes that lasted 3 days. In June and July  I had a bad episode, nothing in Aug and in Sept there were 2 episodes lasting 1 h"  Associated symptoms: sometimes nausea, no vomiting.  Aura: Endorsed. " Lately no  stars"  Activity: In the winter, I am not as active, so maybe if I am in the computer too long may trigger. "TV does not bother me".   Aggravating factors: now that I am not working I have less stress"."I am focusing on staying organized". 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 I like working on the yard"  Relieving factors: lies down, puts a towel on the forehead, light and sounds may make it worse. Working out is relaxing to him   Current abortive medications: Sumatriptan  50 mg at the first onset of HA, max 4 in a day, Tylenol as needed Current prophylactic medications: None  Past abortive medications:  BC powder 2-3 a week , Excedrin, [employer] . Past prophylactic medications:   Family history: Negative for migraines. Smoker: cigars  "Once every 4 months"  Alcohol: not recently  Caffeine: 16  oz  a day of coffee .   Sleep:   No more than 3-4 h hours a night. To start trazodone soon  MRI brain 11/2021 Unremarkable MRI appearance of the brain. No evidence of acute intracranial abnormality.   Past Medical History:  Diagnosis Date   Heart murmur    Herpes    High cholesterol    Hypertension      Past Surgical History:  Procedure Laterality Date   EYE SURGERY Right    LUMBAR EPIDURAL INJECTION       PREVIOUS  MEDICATIONS:   CURRENT MEDICATIONS:  Outpatient Encounter Medications as of 06/21/2023  Medication Sig   amLODipine (NORVASC) 10 MG tablet Take 10 mg by mouth daily.   aspirin 81 MG chewable tablet 1 tablet   atorvastatin (LIPITOR) 40 MG tablet Take 40 mg by mouth daily.   busPIRone (BUSPAR) 15 MG tablet TAKE ONE TABLET BY MOUTH THREE TIMES A DAY FOR ANXIETY   cholecalciferol (VITAMIN D3) 25 MCG (1000 UNIT) tablet Take 2,000 Units by mouth daily.   cyclobenzaprine (FLEXERIL) 10 MG tablet    diazepam (VALIUM) 5 MG tablet Take 1 tablet (5 mg total) by mouth 2 (two) times daily.   fluticasone (FLONASE) 50 MCG/ACT nasal spray Place 2 sprays into  both nostrils daily.   hydrOXYzine (ATARAX) 10 MG tablet Take 10 mg by mouth 2 (two) times daily as needed.   naltrexone (DEPADE) 50 MG tablet Take 1 tablet by mouth every morning.   ondansetron (ZOFRAN-ODT) 4 MG disintegrating tablet Take 4 mg by mouth every 8 (eight) hours as needed.   oxyCODONE-acetaminophen (PERCOCET/ROXICET) 5-325 MG tablet Take 2 tablets by mouth every 4 (four) hours as needed for severe pain.   sertraline (ZOLOFT) 100 MG tablet TAKE ONE AND ONE-HALF TABLETS BY MOUTH EVERY MORNING AFTER BREAKFAST FOR ANXIETY, PTSD, AND MOOD   valACYclovir (VALTREX) 500 MG tablet Take 500 mg by mouth daily.   [DISCONTINUED] SUMAtriptan (IMITREX) 50 MG tablet TAKE ONE TABLET BY MOUTH AS DIRECTED AS NEEDED FOR MIGRAINE HEADACHE (FIRST DOSE AT ONSET OF HEADACHE,THEN REPEAT AFTER 2 HOURS IF NO RELIEF. NO MORE THAN 200 MG PER DAY)   SUMAtriptan (IMITREX) 50 MG tablet TAKE ONE TABLET BY MOUTH AS DIRECTED AS NEEDED FOR MIGRAINE HEADACHE (FIRST DOSE AT ONSET OF HEADACHE,THEN REPEAT AFTER 2 HOURS IF NO RELIEF. NO MORE THAN 200 MG PER DAY)   No facility-administered encounter medications on file as of 06/21/2023.     Objective:     PHYSICAL EXAMINATION:    VITALS:   Vitals:   06/21/23 0851  BP: 139/83  Pulse: 79  Resp: 20  SpO2: 97%  Weight: 217 lb (98.4 kg)  Height: 5\' 9"  (1.753 m)      GEN:  The patient appears stated age and is in NAD. HEENT:  Normocephalic, atraumatic.   Neurological examination:  General: No acute distress.  Patient appears well-groomed.   Head:  Normocephalic/atraumatic Eyes:  fundi examined but not visualized Neck: supple, no paraspinal tenderness, full range of motion Back: No paraspinal tenderness Heart: regular rate and rhythm Lungs: Clear to auscultation bilaterally. Vascular: No carotid bruits. Neurological Exam: Mental status: alert and oriented to person, place, and time, recent and remote memory intact, fund of knowledge intact, attention and  concentration intact, speech fluent and not dysarthric, language intact. Cranial nerves: CN I: not tested CN II: pupils equal, round and reactive to light, visual fields intact CN III, IV, VI:  full range of motion, no nystagmus, no ptosis CN V: facial sensation intact. CN VII: upper and lower face symmetric CN VIII: hearing intact CN IX, X: gag intact, uvula midline CN XI: sternocleidomastoid and trapezius muscles intact CN XII: tongue midline Bulk & Tone: normal, no fasciculations. Motor:  muscle strength 5/5 throughout Sensation:  Pinprick, temperature and vibratory sensation intact. Deep Tendon Reflexes:  2+ throughout,  toes downgoing.   Finger to nose testing:  Without dysmetria.   Heel to shin:  Without dysmetria.   Gait:  Normal station and stride.  Romberg negative.  11/09/2021    8:00 AM  Montreal Cognitive Assessment   Visuospatial/ Executive (0/5) 4  Naming (0/3) 3  Attention: Read list of digits (0/2) 2  Attention: Read list of letters (0/1) 1  Attention: Serial 7 subtraction starting at 100 (0/3) 3  Language: Repeat phrase (0/2) 2  Language : Fluency (0/1) 1  Abstraction (0/2) 1  Delayed Recall (0/5) 2  Orientation (0/6) 6  Total 25        No data to display            Thank you for allowing [place] the opportunity to participate in the care of this nice patient. Please do not hesitate to contact us for any questions or concerns.   Total time spent on today's visit was 36 minutes dedicated to this patient today, preparing to see patient, examining the patient, ordering tests and/or medications and counseling the patient, documenting clinical information in the EHR or other health record, independently interpreting results and communicating results to the patient/family, discussing treatment and goals, answering patient's questions and coordinating care.  Cc:  Tally Joe, MD  Marlowe Kays 06/21/2023 9:37 AM

## 2023-06-21 NOTE — Patient Instructions (Addendum)
  Take Sumatriptan 50 mg  at earliest onset of headache.  May repeat dose once in 2 hours if needed.  Maximum 2 tablets in 24 hours. Limit use of pain relievers to no more than 2 days out of the week.  These medications include acetaminophen, NSAIDs (ibuprofen/Advil/Motrin, naproxen/Aleve, triptans (Imitrex/sumatriptan), Excedrin, and narcotics.  This will help reduce risk of rebound headaches. Be aware of common food triggers:  - Caffeine:  coffee, black tea, cola, Mt. Dew  - Chocolate  - Dairy:  aged cheeses (brie, blue, cheddar, gouda, [place], provolone, [place], Swiss, etc), chocolate milk, buttermilk, sour cream, limit eggs and yogurt  - Nuts, peanut butter  - Alcohol  - Cereals/grains:  FRESH breads (fresh bagels, sourdough, doughnuts), yeast productions  - Processed/canned/aged/cured meats (pre-packaged deli meats, hotdogs)  - MSG/glutamate:  soy sauce, flavor enhancer, pickled/preserved/marinated foods  - Sweeteners:  aspartame (Equal, Nutrasweet).  Sugar and Splenda are okay  - Vegetables:  legumes (lima beans, lentils, snow peas, fava beans, pinto peans, peas, garbanzo beans), sauerkraut, onions, olives, pickles  - Fruit:  avocados, bananas, citrus fruit (orange, lemon, grapefruit), mango  - Other:  Frozen meals, macaroni and cheese Routine exercise Stay adequately hydrated (aim for 64 oz water daily) Keep headache diary Maintain proper stress management Maintain proper sleep hygiene Do not skip meals Consider supplements:  magnesium citrate 400mg  daily, riboflavin 400mg  daily, coenzyme Q10 100mg  three times daily.

## 2023-10-09 DIAGNOSIS — E782 Mixed hyperlipidemia: Secondary | ICD-10-CM | POA: Diagnosis not present

## 2023-10-09 DIAGNOSIS — Z0000 Encounter for general adult medical examination without abnormal findings: Secondary | ICD-10-CM | POA: Diagnosis not present

## 2023-10-09 DIAGNOSIS — N281 Cyst of kidney, acquired: Secondary | ICD-10-CM | POA: Diagnosis not present

## 2023-10-09 DIAGNOSIS — Z125 Encounter for screening for malignant neoplasm of prostate: Secondary | ICD-10-CM | POA: Diagnosis not present

## 2023-10-09 DIAGNOSIS — E1169 Type 2 diabetes mellitus with other specified complication: Secondary | ICD-10-CM | POA: Diagnosis not present

## 2023-10-09 DIAGNOSIS — M545 Low back pain, unspecified: Secondary | ICD-10-CM | POA: Diagnosis not present

## 2023-10-09 DIAGNOSIS — A6000 Herpesviral infection of urogenital system, unspecified: Secondary | ICD-10-CM | POA: Diagnosis not present

## 2023-11-15 ENCOUNTER — Encounter (HOSPITAL_BASED_OUTPATIENT_CLINIC_OR_DEPARTMENT_OTHER): Payer: Self-pay

## 2023-11-28 DIAGNOSIS — M25512 Pain in left shoulder: Secondary | ICD-10-CM | POA: Diagnosis not present

## 2023-11-28 DIAGNOSIS — M19012 Primary osteoarthritis, left shoulder: Secondary | ICD-10-CM | POA: Diagnosis not present

## 2023-11-28 DIAGNOSIS — M50322 Other cervical disc degeneration at C5-C6 level: Secondary | ICD-10-CM | POA: Diagnosis not present

## 2023-12-19 DIAGNOSIS — E119 Type 2 diabetes mellitus without complications: Secondary | ICD-10-CM | POA: Diagnosis not present

## 2023-12-19 DIAGNOSIS — M25512 Pain in left shoulder: Secondary | ICD-10-CM | POA: Diagnosis not present

## 2023-12-19 DIAGNOSIS — F32A Depression, unspecified: Secondary | ICD-10-CM | POA: Diagnosis not present

## 2023-12-19 DIAGNOSIS — G43909 Migraine, unspecified, not intractable, without status migrainosus: Secondary | ICD-10-CM | POA: Diagnosis not present

## 2023-12-24 ENCOUNTER — Ambulatory Visit: Payer: No Typology Code available for payment source | Admitting: Physician Assistant

## 2023-12-26 ENCOUNTER — Ambulatory Visit: Admitting: Physical Therapy

## 2023-12-26 ENCOUNTER — Ambulatory Visit (INDEPENDENT_AMBULATORY_CARE_PROVIDER_SITE_OTHER): Payer: No Typology Code available for payment source | Admitting: Physician Assistant

## 2023-12-26 ENCOUNTER — Encounter: Payer: Self-pay | Admitting: Physician Assistant

## 2023-12-26 VITALS — BP 146/88 | HR 61 | Resp 20 | Ht 69.0 in | Wt 222.0 lb

## 2023-12-26 DIAGNOSIS — M5459 Other low back pain: Secondary | ICD-10-CM | POA: Diagnosis not present

## 2023-12-26 DIAGNOSIS — R519 Headache, unspecified: Secondary | ICD-10-CM

## 2023-12-26 DIAGNOSIS — M25562 Pain in left knee: Secondary | ICD-10-CM

## 2023-12-26 DIAGNOSIS — G8929 Other chronic pain: Secondary | ICD-10-CM

## 2023-12-26 DIAGNOSIS — M25561 Pain in right knee: Secondary | ICD-10-CM | POA: Diagnosis not present

## 2023-12-26 MED ORDER — PROPRANOLOL HCL ER 60 MG PO CP24: 60.0000 mg | ORAL_CAPSULE | Freq: Every day | ORAL | 11 refills | Status: DC

## 2023-12-26 MED ORDER — PROPRANOLOL HCL 60 MG PO TABS: 60.0000 mg | ORAL_TABLET | Freq: Every day | ORAL | 11 refills | Status: DC

## 2023-12-26 NOTE — Therapy (Unsigned)
 OUTPATIENT PHYSICAL THERAPY LOWER EXTREMITY EVALUATION   Patient Name: Greg Dillon MRN: [id number] DOB:1966-04-28, 58 y.o., male Today's Date: 12/26/2023  END OF SESSION:  PT End of Session - 12/31/23 0902     Visit Number 1    Number of Visits 16    Date for PT Re-Evaluation 02/20/24    Authorization Type [employer]    PT Start Time 1100    PT Stop Time 1144    PT Time Calculation (min) 44 min    Activity Tolerance Patient tolerated treatment well    Behavior During Therapy WFL for tasks assessed/performed             Past Medical History:  Diagnosis Date   Heart murmur    Herpes    High cholesterol    Hypertension    Past Surgical History:  Procedure Laterality Date   EYE SURGERY Right    LUMBAR EPIDURAL INJECTION     There are no active problems to display for this patient.   PCP: Rae Bugler  REFERRING PROVIDER: Rae Bugler  REFERRING DIAG: Bil knee pain, low back pain   THERAPY DIAG:  Bilateral chronic knee pain  Other low back pain  Rationale for Evaluation and Treatment: Rehabilitation  ONSET DATE:    SUBJECTIVE:   SUBJECTIVE STATEMENT: Pt states pain in multiple locations. He has most pain in back and knees. He Saw Al[facility] at [facility] for knees. Also sees [place] in [place]. Had arthroscopic surgery in 2023  on R, and  2024 oct on L. States ongoing pain in both knees L>R. Pain and difficulty with stairs and when squatting to sit down.   Back: Ongoing back pain, did have injection in 2015, but none since then.  States pain is worse with Sitting , likes lumbar support for car, standing helps.  Other pain:  Seeing neurology for migraines.  Surgery on R foot for bunion in the past, now L painful foot Bil hip pain L> L shoulder pain- severe, with significant Rom loss, has MRI scheduled for May   Pt Lives on 1 level, Pt does not work at this time, is on temporary disability, for mental health.  Tries to walk with wife, but has pain with most  activity.    PERTINENT HISTORY:  Insomnia, DM, multiple pain sites.   PAIN:  Are you having pain? Yes: NPRS scale: R: 6-7/10;  L: 8-9/10 Pain location: L>R pain Pain description: sore, sharp, limiting  Aggravating factors: Up and down, stairs,  sitting down,  Relieving factors: none stated    Are you having pain? Yes: NPRS scale: 5  /10 Pain location: low back  Pain description: constant, dull soreness. Denies any leg pain.  Aggravating factors: bending, sitting.  Relieving factors: none stated    PRECAUTIONS: None   WEIGHT BEARING RESTRICTIONS: No   FALLS:  Has patient fallen in last 6 months? No  PLOF: Independent  PATIENT GOALS:  Decreased pain in knees, back, shoulder, hips. , be more active.   NEXT MD VISIT:   OBJECTIVE:   DIAGNOSTIC FINDINGS:   PATIENT SURVEYS:    COGNITION: Overall cognitive status: Within functional limits for tasks assessed     SENSATION: WFL  EDEMA:   POSTURE:    No Significant postural limitations  PALPATION: Tenderness at medial and lateral joint line bil knees.  Tenderness/pain in center of upper/mid lumbar spine.   LOWER EXTREMITY ROM: Hips: Mild limitation for flex and rotation bil Knees: WFL bil;  Lumbar:  Flexion:  mod limitation,  Ext: mild limitation/pain,    LOWER EXTREMITY MMT:  MMT Left eval Right  eval  Hip flexion 4 4  Hip extension    Hip abduction 4 4  Hip adduction    Hip internal rotation    Hip external rotation    Knee flexion 4+ 4+  Knee extension 4+ 4+  Ankle dorsiflexion    Ankle plantarflexion    Ankle inversion    Ankle eversion     (Blank rows = not tested)  LOWER EXTREMITY SPECIAL TESTS:    FUNCTIONAL TESTS:  Stairs: Will test next visit.   GAIT:    TODAY'S TREATMENT:                                                                                                                              DATE:     PATIENT EDUCATION:  Education details: PT POC, Exam findings, Will  issue Hep next visit  Person educated: Patient Education method: Explanation, Demonstration, Tactile cues, Verbal cues, and Handouts Education comprehension: verbalized understanding, returned demonstration, verbal cues required, tactile cues required, and needs further education   HOME EXERCISE PROGRAM:   ASSESSMENT:  CLINICAL IMPRESSION: Patient presents with primary complaint of  pain in multiple locations. He was referred for Bil knee pain and back pain. He also has bil hip pain, L foot pain and severe L shoulder pain. He has MRI scheduled for L shoulder in next week or so, and will f/u with MD for that. He has severe limitation and pain for arom, but near normal prom with screening today.  He has increased pain in bil knees, with decreased strength and stability bilaterally. He also has lack of effective HEP for ongoing pain and has had previous surgery on both. He has limitations with squat/bend motions and sustaining functional activity due to pain. We will be doing hip strengthening for knee pain which will also likely help hip pain. He also has ongoing back pain, also has lack of effective HEP for this, and will benefit from education on mobility and core strength for improving pain. Pt with decreased ability for full functional activities. Pt will  benefit from skilled PT to improve deficits and pain and to return to PLOF.   OBJECTIVE IMPAIRMENTS: decreased activity tolerance, decreased mobility, decreased ROM, decreased strength, increased muscle spasms, improper body mechanics, and pain.   ACTIVITY LIMITATIONS: carrying, lifting, bending, sitting, standing, squatting, stairs, transfers, reach over head, hygiene/grooming, and locomotion level  PARTICIPATION LIMITATIONS: meal prep, cleaning, laundry, driving, shopping, community activity, and yard work  PERSONAL FACTORS: Past/current experiences, Time since onset of injury/illness/exacerbation, and 1-2 comorbidities: multiple pain  locations/body parts   are also affecting patient's functional outcome.   REHAB POTENTIAL: Good  CLINICAL DECISION MAKING: Evolving/moderate complexity  EVALUATION COMPLEXITY: Moderate   GOALS: Goals reviewed with patient? Yes  SHORT TERM GOALS: Target date: 01/09/24  Pt to be independent with initial HEP.   Goal  status: INITIAL  2.  Pt to demo optimal back and knee mechanics for sit to stand transfer for improved ease and pain of transfer.   Goal status: INITIAL    LONG TERM GOALS: Target date:  02/20/24  Pt to be independent with final HEP for back and knees.   Goal status: INITIAL  2.  Pt to demo max strength ability for quads and hips, bilaterally, to improve pain and stability.   Goal status: INITIAL  3.  Pt to demo ability for reciprocal stairs and sit to stand transfer with pain 0-2/10 to improve ability for community activities.   Goal status: INITIAL  4.  Pt to report at least 75 % improvement in overall symptoms for back and knee pain.   Goal status: INITIAL  5.  Pt to report decreased pain 0-3/10 in back, with sitting and standing activities for at least 30 min.   Goal status: INITIAL     PLAN:  PT FREQUENCY: 1-2x/week  PT DURATION: 8 weeks  PLANNED INTERVENTIONS: Therapeutic exercises, Therapeutic activity, Neuromuscular re-education, Patient/Family education, Self Care, Joint mobilization, Joint manipulation, Stair training, Orthotic/Fit training, DME instructions, Aquatic Therapy, Dry Needling, Electrical stimulation, Cryotherapy, Moist heat, Taping, Ultrasound, Ionotophoresis 4mg /ml Dexamethasone, Manual therapy,  Vasopneumatic device, Traction, Spinal manipulation, Spinal mobilization,Balance training, Gait training,   PLAN FOR NEXT SESSION:  initial HEP, quad/hip strength, Lumbar stretches/mobility.   Terrilee Few, PT, DPT 9:03 AM  12/31/23

## 2023-12-26 NOTE — Patient Instructions (Addendum)
  Take Sumatriptan  50 mg  at earliest onset of headache.  May repeat dose once in 2 hours if needed.  Maximum 2 tablets in 24 hours Start propanolol 60 mg daily. Limit use of pain relievers to no more than 2 days out of the week.  These medications include acetaminophen , NSAIDs (ibuprofen /Advil /Motrin , naproxen/Aleve, triptans (Imitrex /sumatriptan ), Excedrin, and narcotics.  This will help reduce risk of rebound headaches. Consider supplements:  magnesium citrate 400mg  daily, riboflavin 400mg  daily, coenzyme Q10 100mg  three times daily. Be aware of common food triggers:  - Caffeine:  coffee, black tea, cola, Mt. Dew  - Chocolate  - Dairy:  aged cheeses (brie, blue, cheddar, gouda, Parmasan, provolone, romano, Swiss, etc), chocolate milk, buttermilk, sour cream, limit eggs and yogurt  - Nuts, peanut butter  - Alcohol  - Cereals/grains:  FRESH breads (fresh bagels, sourdough, doughnuts), yeast productions  - Processed/canned/aged/cured meats (pre-packaged deli meats, hotdogs)  - MSG/glutamate:  soy sauce, flavor enhancer, pickled/preserved/marinated foods  - Sweeteners:  aspartame (Equal, Nutrasweet).  Sugar and Splenda are okay  - Vegetables:  legumes (lima beans, lentils, snow peas, fava beans, pinto peans, peas, garbanzo beans), sauerkraut, onions, olives, pickles  - Fruit:  avocados, bananas, citrus fruit (orange, lemon, grapefruit), mango  - Other:  Frozen meals, macaroni and cheese Routine exercise Stay adequately hydrated (aim for 64 oz water daily) Keep headache diary Maintain proper stress management Maintain proper sleep hygiene Do not skip meals

## 2023-12-26 NOTE — Progress Notes (Signed)
 Assessment/Plan:   Chronic Non Intractable Headaches    For preventative management, will stat propanolol 60 mg daily, side effects discussed    For abortive therapy, continue Sumatriptan  50 mg at the first onset of headaches, can take another dose 2 h prn, max 2 a day  as per PCP . May consider increasing to 100 if not therapeutic.  Limit use of pain relievers to no more than 2 days out of week to prevent risk of rebound or medication-overuse headache.  Keep headache diary  Exercise, hydration, caffeine cessation, sleep hygiene, monitor for and avoid triggers  Consider:  magnesium citrate 400mg  daily, riboflavin 400mg  daily, and coenzyme Q10 100mg  three times daily Follow up 6 months     Subjective:   This patient is here alone  Previous records as well as any outside records available were reviewed prior to todays visit.      Onset: started at age [age], in College had some episodes and but were never treated with medicines.  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 Since then, when internalizing issues, his HA is worse.  Sometimes, it interferes with comprehension.  Of note, in the recent past the patient had been evaluated for memory issues, yielding completely normal evaluation and imaging. His memory is better since he retired and has less stressed, he is making an effort to "organize the thoughts". He is learning routines and tries to learn "the new normal" Quality: usually bet ween 8/10, but today he had an episode of 10/10 Intensity: Sharp, like a knife or constant ache, pounding sometimes.   Location: bandlike in the frontal area and a burning sensation "stings" in the back of the head", "sometimes extending B sides of the head and the face" . And at times he feels the pain "behind the eyeballs " Duration:  Usually 1 h  Frequency:  " 15 times a month" ."they come in blocks" " One in March 30, early April ( lasting 5 days) ,  Mid April ( 2 days) and Today, now subsiding (reporting that it may have been triggered by arthritis shoulder pain).   Associated symptoms: sometimes nausea, no vomiting.  Aura: Endorsed. " Occasionally seen flashes of light "  Activity: "In the winter, I am not as active, so maybe if I am in the computer too long may trigger. "TV does not bother me".   Aggravating factors: Has sinusitis, takes Flonase, so the sneezing may aggravate the headache.  There is some stress, death in the family so he is dealing with all the legal matters. Going to hotels really stresses him and it may trigger an episode. .  Relieving factors: lies down, puts a towel on the forehead, trying to sleep, light and sounds may make it worse. 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 I like working on the yard but joint pain has resulted in decreased activity.   Current abortive medications: Sumatriptan   50 mg at the first onset of headache, max 4 in a day, but has not bee helping Tylenol  as needed because he is concerned about liver complications Current prophylactic medications: None  Past abortive medications:  BC powder 2-3 a week , Excedrin, Goody Powder, Tylenol  . Past prophylactic medications:   Family history: Negative for migraines. Smoker: cigars  "Once every 4 months"  Alcohol: not recently. Has done therapy and medications and this has resolved  Caffeine: 16  oz  a day of coffee  A couple of times a week may have 2-3 cups a day.   Sleep:   Sleeps better on trazodone  MRI brain 11/2021 Unremarkable MRI appearance of the brain. No evidence of acute intracranial abnormality.   Past Medical History:  Diagnosis Date   Heart murmur    Herpes    High cholesterol    Hypertension      Past Surgical History:  Procedure Laterality Date   EYE SURGERY Right    LUMBAR EPIDURAL INJECTION       PREVIOUS MEDICATIONS:   CURRENT MEDICATIONS:  Outpatient Encounter  Medications as of 12/26/2023  Medication Sig   amLODipine (NORVASC) 10 MG tablet Take 10 mg by mouth daily.   aspirin  81 MG chewable tablet 1 tablet   atorvastatin (LIPITOR) 40 MG tablet Take 40 mg by mouth daily.   busPIRone (BUSPAR) 15 MG tablet TAKE ONE TABLET BY MOUTH THREE TIMES A DAY FOR ANXIETY   cholecalciferol (VITAMIN D3) 25 MCG (1000 UNIT) tablet Take 2,000 Units by mouth daily.   cyclobenzaprine (FLEXERIL) 10 MG tablet    fluticasone (FLONASE) 50 MCG/ACT nasal spray Place 2 sprays into both nostrils daily.   hydrOXYzine (ATARAX) 10 MG tablet Take 10 mg by mouth 2 (two) times daily as needed.   naltrexone (DEPADE) 50 MG tablet Take 1 tablet by mouth every morning.   ondansetron  (ZOFRAN -ODT) 4 MG disintegrating tablet Take 4 mg by mouth every 8 (eight) hours as needed.   propranolol  (INDERAL ) 60 MG tablet Take 1 tablet (60 mg total) by mouth daily.   sertraline (ZOLOFT) 100 MG tablet TAKE ONE AND ONE-HALF TABLETS BY MOUTH EVERY MORNING AFTER BREAKFAST FOR ANXIETY, PTSD, AND MOOD   SUMAtriptan  (IMITREX ) 50 MG tablet TAKE ONE TABLET BY MOUTH AS DIRECTED AS NEEDED FOR MIGRAINE HEADACHE (FIRST DOSE AT ONSET OF HEADACHE,THEN REPEAT AFTER 2 HOURS IF NO RELIEF. NO MORE THAN 200 MG PER DAY)   traZODone (DESYREL) 150 MG tablet Take by mouth at bedtime.   valACYclovir (VALTREX) 500 MG tablet Take 500 mg by mouth daily.   [DISCONTINUED] oxyCODONE -acetaminophen  (PERCOCET/ROXICET) 5-325 MG tablet Take 2 tablets by mouth every 4 (four) hours as needed for severe pain.   [DISCONTINUED] propranolol  ER (INDERAL  LA) 60 MG 24 hr capsule Take 1 capsule (60 mg total) by mouth daily.   [DISCONTINUED] diazepam  (VALIUM ) 5 MG tablet Take 1 tablet (5 mg total) by mouth 2 (two) times daily.   No facility-administered encounter medications on file as of 12/26/2023.     Objective:     PHYSICAL EXAMINATION:    VITALS:   Vitals:   12/26/23 0847 12/26/23 0857  BP: (!) 144/92 (!) 146/88  Pulse: 61   Resp:  20   SpO2: 99%   Weight: 222 lb (100.7 kg)   Height: 5\' 9"  (1.753 m)        GEN:  The patient appears stated age and is in NAD. HEENT:  Normocephalic, atraumatic.   Neurological examination:  General: No acute distress. Room is darker to not aggravate the resolving headache symptoms. Patient appears well-groomed.   Head:  Normocephalic/atraumatic Eyes:  fundi examined but not visualized Neck: supple, no paraspinal tenderness, full range of motion Back: No paraspinal tenderness Heart: regular rate and rhythm Lungs: Clear to auscultation bilaterally. Vascular: No carotid bruits. Neurological Exam: Mental status: alert and oriented to person, place, and time, recent and remote memory intact, fund of knowledge intact, attention and  concentration intact, speech fluent and not dysarthric, language intact. Cranial nerves: CN I: not tested CN II: pupils equal, round and reactive to light, visual fields intact CN III, IV, VI:  full range of motion, no nystagmus, no ptosis CN V: facial sensation intact. CN VII: upper and lower face symmetric CN VIII: hearing intact CN IX, X: gag intact, uvula midline CN XI: sternocleidomastoid and trapezius muscles intact CN XII: tongue midline Bulk & Tone: normal, no fasciculations. Motor:  muscle strength 5/5 throughout Sensation:  Pinprick, temperature and vibratory sensation intact. Deep Tendon Reflexes:  2+ throughout,  toes downgoing.   Finger to nose testing:  Without dysmetria.   Heel to shin:  Without dysmetria.   Gait:  Normal station and stride.  Romberg negative.      11/09/2021    8:00 AM  Montreal Cognitive Assessment   Visuospatial/ Executive (0/5) 4  Naming (0/3) 3  Attention: Read list of digits (0/2) 2  Attention: Read list of letters (0/1) 1  Attention: Serial 7 subtraction starting at 100 (0/3) 3  Language: Repeat phrase (0/2) 2  Language : Fluency (0/1) 1  Abstraction (0/2) 1  Delayed Recall (0/5) 2  Orientation (0/6)  6  Total 25        No data to display            Thank you for allowing us  the opportunity to participate in the care of this nice patient. Please do not hesitate to contact us  for any questions or concerns.   Total time spent on today's visit was 30  minutes dedicated to this patient today, preparing to see patient, examining the patient, ordering tests and/or medications and counseling the patient, documenting clinical information in the EHR or other health record, independently interpreting results and communicating results to the patient/family, discussing treatment and goals, answering patient's questions and coordinating care.  Cc:  Rae Bugler, MD  Tex Filbert 12/26/2023 11:50 AM

## 2023-12-31 ENCOUNTER — Encounter: Payer: Self-pay | Admitting: Physical Therapy

## 2024-01-03 ENCOUNTER — Encounter: Admitting: Physical Therapy

## 2024-01-08 DIAGNOSIS — F32A Depression, unspecified: Secondary | ICD-10-CM | POA: Diagnosis not present

## 2024-01-08 DIAGNOSIS — M2142 Flat foot [pes planus] (acquired), left foot: Secondary | ICD-10-CM | POA: Diagnosis not present

## 2024-01-08 DIAGNOSIS — Z7984 Long term (current) use of oral hypoglycemic drugs: Secondary | ICD-10-CM | POA: Diagnosis not present

## 2024-01-08 DIAGNOSIS — E119 Type 2 diabetes mellitus without complications: Secondary | ICD-10-CM | POA: Diagnosis not present

## 2024-01-09 DIAGNOSIS — M24831 Other specific joint derangements of right wrist, not elsewhere classified: Secondary | ICD-10-CM | POA: Diagnosis not present

## 2024-01-09 DIAGNOSIS — M180 Bilateral primary osteoarthritis of first carpometacarpal joints: Secondary | ICD-10-CM | POA: Diagnosis not present

## 2024-01-10 ENCOUNTER — Encounter: Admitting: Physical Therapy

## 2024-01-14 DIAGNOSIS — M75112 Incomplete rotator cuff tear or rupture of left shoulder, not specified as traumatic: Secondary | ICD-10-CM | POA: Diagnosis not present

## 2024-01-14 DIAGNOSIS — M19032 Primary osteoarthritis, left wrist: Secondary | ICD-10-CM | POA: Diagnosis not present

## 2024-01-14 DIAGNOSIS — M19031 Primary osteoarthritis, right wrist: Secondary | ICD-10-CM | POA: Diagnosis not present

## 2024-01-14 DIAGNOSIS — M19012 Primary osteoarthritis, left shoulder: Secondary | ICD-10-CM | POA: Diagnosis not present

## 2024-01-17 ENCOUNTER — Encounter: Payer: Self-pay | Admitting: Physical Therapy

## 2024-01-17 ENCOUNTER — Ambulatory Visit: Admitting: Physical Therapy

## 2024-01-17 DIAGNOSIS — M25562 Pain in left knee: Secondary | ICD-10-CM | POA: Diagnosis not present

## 2024-01-17 DIAGNOSIS — M5459 Other low back pain: Secondary | ICD-10-CM | POA: Diagnosis not present

## 2024-01-17 DIAGNOSIS — G8929 Other chronic pain: Secondary | ICD-10-CM | POA: Diagnosis not present

## 2024-01-17 DIAGNOSIS — M25561 Pain in right knee: Secondary | ICD-10-CM

## 2024-01-17 NOTE — Therapy (Addendum)
 " OUTPATIENT PHYSICAL THERAPY LOWER EXTREMITY TREATMENT   Patient Name: Greg Dillon MRN: [id number] DOB:Nov 26, 1965, 58 y.o., male Today's Date:01/17/2024   END OF SESSION:  PT End of Session - 01/17/24 0846     Visit Number 2    Number of Visits 16    Date for PT Re-Evaluation 02/20/24    Authorization Type [employer]    PT Start Time 0847    PT Stop Time 0905    PT Time Calculation (min) 18 min    Activity Tolerance Patient tolerated treatment well    Behavior During Therapy WFL for tasks assessed/performed             Past Medical History:  Diagnosis Date   Heart murmur    Herpes    High cholesterol    Hypertension    Past Surgical History:  Procedure Laterality Date   EYE SURGERY Right    LUMBAR EPIDURAL INJECTION     There are no active problems to display for this patient.   PCP: Alm Rav  REFERRING PROVIDER: Alm Rav  REFERRING DIAG: Bil knee pain, low back pain   THERAPY DIAG:  Bilateral chronic knee pain  Other low back pain  Rationale for Evaluation and Treatment: Rehabilitation  ONSET DATE:    SUBJECTIVE:   SUBJECTIVE STATEMENT:  Pt last seen 4/23. MRI scheduled for both knees today.  He reports that he is able to get PT through the [facility] and would like to persue that. He also states that he needs an FCE, wondering if we can do at this clinic. Explained that we do not offer that service at this clinic, information provided to pt of clinics that he can go to for this service. Pt does not think he will be returning to this clinic.    Eval:  Pt states pain in multiple locations. He has most pain in back and knees. He Saw Alusio at [facility] for knees. Also sees [facility] in [place]. Had arthroscopic surgery in 2023  on R, and  2024 oct on L. States ongoing pain in both knees L>R. Pain and difficulty with stairs and when squatting to sit down.   Back: Ongoing back pain, did have injection in 2015, but none since then.  States pain is worse with  Sitting , likes lumbar support for car, standing helps.  Other pain:  Seeing neurology for migraines.  Surgery on R foot for bunion in the past, now L painful foot Bil hip pain L> L shoulder pain- severe, with significant Rom loss, has MRI scheduled for May   Pt Lives on 1 [place], Pt does not work at this time, is on temporary disability, for mental health.  Tries to walk with wife, but has pain with most activity.    PERTINENT HISTORY:  Insomnia, DM, multiple pain sites.   PAIN:  Are you having pain? Yes: NPRS scale: R: 6-7/10;  L: 8-9/10 Pain location: L>R pain Pain description: sore, sharp, limiting  Aggravating factors: Up and down, stairs,  sitting down,  Relieving factors: none stated    Are you having pain? Yes: NPRS scale: 5  /10 Pain location: low back  Pain description: constant, dull soreness. Denies any leg pain.  Aggravating factors: bending, sitting.  Relieving factors: none stated    PRECAUTIONS: None   WEIGHT BEARING RESTRICTIONS: No   FALLS:  Has patient fallen in last 6 months? No  PLOF: Independent  PATIENT GOALS:  Decreased pain in knees, back, shoulder, hips. , be  more active.   NEXT MD VISIT:   OBJECTIVE:   DIAGNOSTIC FINDINGS:   PATIENT SURVEYS:    COGNITION: Overall cognitive status: Within functional limits for tasks assessed     SENSATION: WFL  EDEMA:   POSTURE:    No Significant postural limitations  PALPATION: Tenderness at medial and lateral joint line bil knees.  Tenderness/pain in center of upper/mid lumbar spine.   LOWER EXTREMITY ROM: Hips: Mild limitation for flex and rotation bil Knees: WFL bil;  Lumbar:  Flexion: mod limitation,  Ext: mild limitation/pain,    LOWER EXTREMITY MMT:  MMT Left eval Right  eval  Hip flexion 4 4  Hip extension    Hip abduction 4 4  Hip adduction    Hip internal rotation    Hip external rotation    Knee flexion 4+ 4+  Knee extension 4+ 4+  Ankle dorsiflexion    Ankle  plantarflexion    Ankle inversion    Ankle eversion     (Blank rows = not tested)  LOWER EXTREMITY SPECIAL TESTS:    FUNCTIONAL TESTS:  Stairs: Will test next visit.   GAIT:    TODAY'S TREATMENT:                                                                                                                              DATE:  5/18: discussed current state, benefits of PT, and gave suggestions of clinics that can offer services that pt needs- FCE.    PATIENT EDUCATION:  Education details: PT POC, Exam findings, Will issue Hep next visit  Person educated: Patient Education method: Explanation, Demonstration, Tactile cues, Verbal cues, and Handouts Education comprehension: verbalized understanding, returned demonstration, verbal cues required, tactile cues required, and needs further education   HOME EXERCISE PROGRAM:   ASSESSMENT:  CLINICAL IMPRESSION: 01/17/2024 Pt last seen 4/23. MRI scheduled for both knees today.  He reports that he is able to get PT through the [facility] and would like to persue that. He also states that he needs an FCE, wondering if we can do at this clinic. Explained that we do not offer that service at this clinic, information provided to pt of clinics that he can go to for this service. Pt does not think he will be returning to this clinic.    Eval: Patient presents with primary complaint of  pain in multiple locations. He was referred for Bil knee pain and back pain. He also has bil hip pain, L foot pain and severe L shoulder pain. He has MRI scheduled for L shoulder in next week or so, and will f/u with MD for that. He has severe limitation and pain for arom, but near normal prom with screening today.  He has increased pain in bil knees, with decreased strength and stability bilaterally. He also has lack of effective HEP for ongoing pain and has had previous surgery on both. He has limitations with squat/bend motions and sustaining  functional activity due to  pain. We will be doing hip strengthening for knee pain which will also likely help hip pain. He also has ongoing back pain, also has lack of effective HEP for this, and will benefit from education on mobility and core strength for improving pain. Pt with decreased ability for full functional activities. Pt will  benefit from skilled PT to improve deficits and pain and to return to PLOF.   OBJECTIVE IMPAIRMENTS: decreased activity tolerance, decreased mobility, decreased ROM, decreased strength, increased muscle spasms, improper body mechanics, and pain.   ACTIVITY LIMITATIONS: carrying, lifting, bending, sitting, standing, squatting, stairs, transfers, reach over head, hygiene/grooming, and locomotion level  PARTICIPATION LIMITATIONS: meal prep, cleaning, laundry, driving, shopping, community activity, and yard work  PERSONAL FACTORS: Past/current experiences, Time since onset of injury/illness/exacerbation, and 1-2 comorbidities: multiple pain locations/body parts  are also affecting patient's functional outcome.   REHAB POTENTIAL: Good  CLINICAL DECISION MAKING: Evolving/moderate complexity  EVALUATION COMPLEXITY: Moderate   GOALS: Goals reviewed with patient? Yes  SHORT TERM GOALS: Target date: 01/09/24  Pt to be independent with initial HEP.   Goal status: INITIAL  2.  Pt to demo optimal back and knee mechanics for sit to stand transfer for improved ease and pain of transfer.   Goal status: INITIAL    LONG TERM GOALS: Target date:  02/20/24  Pt to be independent with final HEP for back and knees.   Goal status: INITIAL  2.  Pt to demo max strength ability for quads and hips, bilaterally, to improve pain and stability.   Goal status: INITIAL  3.  Pt to demo ability for reciprocal stairs and sit to stand transfer with pain 0-2/10 to improve ability for community activities.   Goal status: INITIAL  4.  Pt to report at least 75 % improvement in overall symptoms for back and  knee pain.   Goal status: INITIAL  5.  Pt to report decreased pain 0-3/10 in back, with sitting and standing activities for at least 30 min.   Goal status: INITIAL     PLAN:  PT FREQUENCY: 1-2x/week  PT DURATION: 8 weeks  PLANNED INTERVENTIONS: Therapeutic exercises, Therapeutic activity, Neuromuscular re-education, Patient/Family education, Self Care, Joint mobilization, Joint manipulation, Stair training, Orthotic/Fit training, DME instructions, Aquatic Therapy, Dry Needling, Electrical stimulation, Cryotherapy, Moist heat, Taping, Ultrasound, Ionotophoresis 4mg /ml Dexamethasone, Manual therapy,  Vasopneumatic device, Traction, Spinal manipulation, Spinal mobilization,Balance training, Gait training,   PLAN FOR NEXT SESSION:  initial HEP, quad/hip strength, Lumbar stretches/mobility.   Tinnie Don, PT, DPT 9:10 AM  01/17/24  PHYSICAL THERAPY DISCHARGE SUMMARY  Visits from Start of Care: 2   Plan: Patient agrees to discharge.  Patient goals were not met. Patient is being discharged due to Will be going to a different PT clinic.    Tinnie Don, PT, DPT 11:52 AM  09/30/24      "

## 2024-01-22 ENCOUNTER — Other Ambulatory Visit: Payer: Self-pay

## 2024-01-22 ENCOUNTER — Encounter: Payer: Self-pay | Admitting: Physician Assistant

## 2024-01-22 DIAGNOSIS — R404 Transient alteration of awareness: Secondary | ICD-10-CM

## 2024-01-24 ENCOUNTER — Encounter: Admitting: Physical Therapy

## 2024-01-25 ENCOUNTER — Ambulatory Visit (INDEPENDENT_AMBULATORY_CARE_PROVIDER_SITE_OTHER)

## 2024-01-25 DIAGNOSIS — M25562 Pain in left knee: Secondary | ICD-10-CM | POA: Diagnosis not present

## 2024-01-25 DIAGNOSIS — M25561 Pain in right knee: Secondary | ICD-10-CM | POA: Diagnosis not present

## 2024-01-25 DIAGNOSIS — M545 Low back pain, unspecified: Secondary | ICD-10-CM | POA: Diagnosis not present

## 2024-01-25 DIAGNOSIS — G8929 Other chronic pain: Secondary | ICD-10-CM | POA: Diagnosis not present

## 2024-01-25 DIAGNOSIS — R404 Transient alteration of awareness: Secondary | ICD-10-CM

## 2024-01-25 NOTE — Progress Notes (Unsigned)
 EEG complete and ready for review.

## 2024-01-29 ENCOUNTER — Encounter (HOSPITAL_BASED_OUTPATIENT_CLINIC_OR_DEPARTMENT_OTHER): Payer: Self-pay

## 2024-01-29 ENCOUNTER — Telehealth: Payer: Self-pay | Admitting: Cardiology

## 2024-01-29 ENCOUNTER — Encounter: Payer: Self-pay | Admitting: Physician Assistant

## 2024-01-29 ENCOUNTER — Ambulatory Visit: Payer: Self-pay | Admitting: Physician Assistant

## 2024-01-29 NOTE — Telephone Encounter (Signed)
Order has been extended.

## 2024-01-29 NOTE — Telephone Encounter (Signed)
 Pt called in to schedule yearly echo. Next available is 02/26/24, however order expires 02/05/24. Please advise if another one can be placed.

## 2024-01-29 NOTE — Procedures (Signed)
 ELECTROENCEPHALOGRAM REPORT  Date of Study: 01/25/2024  Patient's Name: Greg Dillon MRN: [id number] Date of Birth: 1966-06-18  Referring Provider: Tex Filbert, PA-C  Clinical History: This is a 58 year old man with nocturnal shaking/jerking with urinary incontinence, "paralytic state" when awoken, unable to move or speak. EEG for classification.  CNS Active Medications: Buspar, Hydroxyzine, Sertraline, Trazodone  Technical Summary: A multichannel digital EEG recording measured by the international 10-20 system with electrodes applied with paste and impedances below 5000 ohms performed in our laboratory with EKG monitoring in an awake and asleep patient.  Hyperventilation and photic stimulation were performed.  The digital EEG was referentially recorded, reformatted, and digitally filtered in a variety of bipolar and referential montages for optimal display.    Description: The patient is awake and asleep during the recording.  During maximal wakefulness, there is a symmetric, medium voltage 10 Hz posterior dominant rhythm that attenuates with eye opening.  The record is symmetric.  During drowsiness and sleep, there is an increase in theta slowing of the background, at times sharply contoured over the bilateral temporal regions (left greater than right) without clear epileptogenic potential.  Vertex waves and symmetric sleep spindles were seen. Hyperventilation and photic stimulation did not elicit any abnormalities.  There were no epileptiform discharges or electrographic seizures seen.    EKG lead was unremarkable.  Impression: This awake and asleep EEG is within normal limits.  Clinical Correlation: A normal EEG does not exclude a clinical diagnosis of epilepsy.  If further clinical questions remain, prolonged EEG may be helpful.  Clinical correlation is advised.   Rayfield Cairo, M.D.

## 2024-01-29 NOTE — Progress Notes (Signed)
 No answer at 10:36am 01/29/2024. Routed to Benjiman Bras to schedule 72 hour

## 2024-01-31 ENCOUNTER — Encounter: Admitting: Physical Therapy

## 2024-02-06 ENCOUNTER — Telehealth: Payer: Self-pay | Admitting: *Deleted

## 2024-02-07 ENCOUNTER — Encounter: Admitting: Physical Therapy

## 2024-02-12 DIAGNOSIS — Z981 Arthrodesis status: Secondary | ICD-10-CM | POA: Diagnosis not present

## 2024-02-12 DIAGNOSIS — M7731 Calcaneal spur, right foot: Secondary | ICD-10-CM | POA: Diagnosis not present

## 2024-02-12 DIAGNOSIS — M2012 Hallux valgus (acquired), left foot: Secondary | ICD-10-CM | POA: Diagnosis not present

## 2024-02-12 DIAGNOSIS — M2141 Flat foot [pes planus] (acquired), right foot: Secondary | ICD-10-CM | POA: Diagnosis not present

## 2024-02-12 DIAGNOSIS — M19072 Primary osteoarthritis, left ankle and foot: Secondary | ICD-10-CM | POA: Diagnosis not present

## 2024-02-12 DIAGNOSIS — L84 Corns and callosities: Secondary | ICD-10-CM | POA: Diagnosis not present

## 2024-02-12 DIAGNOSIS — M2142 Flat foot [pes planus] (acquired), left foot: Secondary | ICD-10-CM | POA: Diagnosis not present

## 2024-02-18 DIAGNOSIS — S83241A Other tear of medial meniscus, current injury, right knee, initial encounter: Secondary | ICD-10-CM | POA: Diagnosis not present

## 2024-02-18 DIAGNOSIS — S83242A Other tear of medial meniscus, current injury, left knee, initial encounter: Secondary | ICD-10-CM | POA: Diagnosis not present

## 2024-02-18 DIAGNOSIS — M170 Bilateral primary osteoarthritis of knee: Secondary | ICD-10-CM | POA: Diagnosis not present

## 2024-02-19 DIAGNOSIS — M75112 Incomplete rotator cuff tear or rupture of left shoulder, not specified as traumatic: Secondary | ICD-10-CM | POA: Diagnosis not present

## 2024-02-19 DIAGNOSIS — M25512 Pain in left shoulder: Secondary | ICD-10-CM | POA: Diagnosis not present

## 2024-02-26 DIAGNOSIS — N401 Enlarged prostate with lower urinary tract symptoms: Secondary | ICD-10-CM | POA: Diagnosis not present

## 2024-02-26 DIAGNOSIS — R32 Unspecified urinary incontinence: Secondary | ICD-10-CM | POA: Diagnosis not present

## 2024-02-26 DIAGNOSIS — N281 Cyst of kidney, acquired: Secondary | ICD-10-CM | POA: Diagnosis not present

## 2024-02-27 ENCOUNTER — Ambulatory Visit (INDEPENDENT_AMBULATORY_CARE_PROVIDER_SITE_OTHER)

## 2024-02-27 DIAGNOSIS — I5030 Unspecified diastolic (congestive) heart failure: Secondary | ICD-10-CM | POA: Diagnosis not present

## 2024-02-27 DIAGNOSIS — I088 Other rheumatic multiple valve diseases: Secondary | ICD-10-CM

## 2024-02-27 DIAGNOSIS — I77810 Thoracic aortic ectasia: Secondary | ICD-10-CM | POA: Diagnosis not present

## 2024-02-27 DIAGNOSIS — M25512 Pain in left shoulder: Secondary | ICD-10-CM | POA: Diagnosis not present

## 2024-02-27 DIAGNOSIS — M75112 Incomplete rotator cuff tear or rupture of left shoulder, not specified as traumatic: Secondary | ICD-10-CM | POA: Diagnosis not present

## 2024-02-27 DIAGNOSIS — I351 Nonrheumatic aortic (valve) insufficiency: Secondary | ICD-10-CM | POA: Diagnosis not present

## 2024-02-27 LAB — ECHOCARDIOGRAM COMPLETE
AV Vena cont: 0.87 cm
Area-P 1/2: 3.12 cm2
Est EF: 50
P 1/2 time: 695 ms
S' Lateral: 2.9 cm

## 2024-02-28 ENCOUNTER — Ambulatory Visit (HOSPITAL_BASED_OUTPATIENT_CLINIC_OR_DEPARTMENT_OTHER): Payer: Self-pay | Admitting: Family

## 2024-02-28 ENCOUNTER — Telehealth: Payer: Self-pay | Admitting: Cardiology

## 2024-02-28 DIAGNOSIS — I351 Nonrheumatic aortic (valve) insufficiency: Secondary | ICD-10-CM

## 2024-02-28 NOTE — Telephone Encounter (Signed)
 Echocardiogram with normal heart pumping function. Heart muscle moderately stiff.  Mild to moderate leaking of the aortic valve stable from previous. Mild dilation ascending aorta 42mm which is stable from previous. Repeat echocardiogram in 1 year for monitoring.   follow up as scheduled with Dr. Lonni. Overall similar to previous. If any new shortness of breath, chest pain recommend sooner office visit.

## 2024-02-28 NOTE — Telephone Encounter (Signed)
 Patient would like to speak with a nurse in regard to his recent Echo results. Please advise.

## 2024-02-28 NOTE — Telephone Encounter (Signed)
 Called and spoke to pt. DOB and full name verified. Informed pt echo results have not been reviewed as of yet but will let provider know. Will call pt with finalized comments/recommendations. Pt aware and was thankful for call.

## 2024-03-28 DIAGNOSIS — R030 Elevated blood-pressure reading, without diagnosis of hypertension: Secondary | ICD-10-CM | POA: Diagnosis not present

## 2024-03-28 DIAGNOSIS — Z6832 Body mass index (BMI) 32.0-32.9, adult: Secondary | ICD-10-CM | POA: Diagnosis not present

## 2024-03-28 DIAGNOSIS — E6609 Other obesity due to excess calories: Secondary | ICD-10-CM | POA: Diagnosis not present

## 2024-03-28 DIAGNOSIS — A048 Other specified bacterial intestinal infections: Secondary | ICD-10-CM | POA: Diagnosis not present

## 2024-04-08 DIAGNOSIS — I10 Essential (primary) hypertension: Secondary | ICD-10-CM | POA: Diagnosis not present

## 2024-04-08 DIAGNOSIS — F4310 Post-traumatic stress disorder, unspecified: Secondary | ICD-10-CM | POA: Diagnosis not present

## 2024-04-08 DIAGNOSIS — E1169 Type 2 diabetes mellitus with other specified complication: Secondary | ICD-10-CM | POA: Diagnosis not present

## 2024-04-08 DIAGNOSIS — E782 Mixed hyperlipidemia: Secondary | ICD-10-CM | POA: Diagnosis not present

## 2024-04-17 DIAGNOSIS — N281 Cyst of kidney, acquired: Secondary | ICD-10-CM | POA: Diagnosis not present

## 2024-04-17 DIAGNOSIS — R1033 Periumbilical pain: Secondary | ICD-10-CM | POA: Diagnosis not present

## 2024-04-17 DIAGNOSIS — R197 Diarrhea, unspecified: Secondary | ICD-10-CM | POA: Diagnosis not present

## 2024-04-17 DIAGNOSIS — R1013 Epigastric pain: Secondary | ICD-10-CM | POA: Diagnosis not present

## 2024-04-17 DIAGNOSIS — K6389 Other specified diseases of intestine: Secondary | ICD-10-CM | POA: Diagnosis not present

## 2024-04-17 DIAGNOSIS — E785 Hyperlipidemia, unspecified: Secondary | ICD-10-CM | POA: Diagnosis not present

## 2024-04-17 DIAGNOSIS — R112 Nausea with vomiting, unspecified: Secondary | ICD-10-CM | POA: Diagnosis not present

## 2024-04-17 DIAGNOSIS — K56609 Unspecified intestinal obstruction, unspecified as to partial versus complete obstruction: Secondary | ICD-10-CM | POA: Diagnosis not present

## 2024-04-17 DIAGNOSIS — F419 Anxiety disorder, unspecified: Secondary | ICD-10-CM | POA: Diagnosis not present

## 2024-04-17 DIAGNOSIS — R1110 Vomiting, unspecified: Secondary | ICD-10-CM | POA: Diagnosis not present

## 2024-04-17 DIAGNOSIS — I10 Essential (primary) hypertension: Secondary | ICD-10-CM | POA: Diagnosis not present

## 2024-04-18 DIAGNOSIS — K56609 Unspecified intestinal obstruction, unspecified as to partial versus complete obstruction: Secondary | ICD-10-CM | POA: Diagnosis not present

## 2024-04-19 DIAGNOSIS — K5989 Other specified functional intestinal disorders: Secondary | ICD-10-CM | POA: Diagnosis not present

## 2024-04-19 DIAGNOSIS — K56609 Unspecified intestinal obstruction, unspecified as to partial versus complete obstruction: Secondary | ICD-10-CM | POA: Diagnosis not present

## 2024-04-20 DIAGNOSIS — K56609 Unspecified intestinal obstruction, unspecified as to partial versus complete obstruction: Secondary | ICD-10-CM | POA: Diagnosis not present

## 2024-04-21 DIAGNOSIS — K56609 Unspecified intestinal obstruction, unspecified as to partial versus complete obstruction: Secondary | ICD-10-CM | POA: Diagnosis not present

## 2024-04-21 DIAGNOSIS — E785 Hyperlipidemia, unspecified: Secondary | ICD-10-CM | POA: Diagnosis not present

## 2024-04-21 DIAGNOSIS — F419 Anxiety disorder, unspecified: Secondary | ICD-10-CM | POA: Diagnosis not present

## 2024-04-21 DIAGNOSIS — R109 Unspecified abdominal pain: Secondary | ICD-10-CM | POA: Diagnosis not present

## 2024-04-21 DIAGNOSIS — I10 Essential (primary) hypertension: Secondary | ICD-10-CM | POA: Diagnosis not present

## 2024-04-22 DIAGNOSIS — K56609 Unspecified intestinal obstruction, unspecified as to partial versus complete obstruction: Secondary | ICD-10-CM | POA: Diagnosis not present

## 2024-04-23 DIAGNOSIS — K5989 Other specified functional intestinal disorders: Secondary | ICD-10-CM | POA: Diagnosis not present

## 2024-04-23 DIAGNOSIS — R1013 Epigastric pain: Secondary | ICD-10-CM | POA: Diagnosis not present

## 2024-04-23 DIAGNOSIS — K56609 Unspecified intestinal obstruction, unspecified as to partial versus complete obstruction: Secondary | ICD-10-CM | POA: Diagnosis not present

## 2024-04-24 DIAGNOSIS — K56609 Unspecified intestinal obstruction, unspecified as to partial versus complete obstruction: Secondary | ICD-10-CM | POA: Diagnosis not present

## 2024-04-25 DIAGNOSIS — K56609 Unspecified intestinal obstruction, unspecified as to partial versus complete obstruction: Secondary | ICD-10-CM | POA: Diagnosis not present

## 2024-05-02 DIAGNOSIS — E7800 Pure hypercholesterolemia, unspecified: Secondary | ICD-10-CM | POA: Diagnosis not present

## 2024-05-02 DIAGNOSIS — K2970 Gastritis, unspecified, without bleeding: Secondary | ICD-10-CM | POA: Diagnosis not present

## 2024-05-02 DIAGNOSIS — K581 Irritable bowel syndrome with constipation: Secondary | ICD-10-CM | POA: Diagnosis not present

## 2024-05-26 DIAGNOSIS — R58 Hemorrhage, not elsewhere classified: Secondary | ICD-10-CM | POA: Diagnosis not present

## 2024-05-30 ENCOUNTER — Encounter (HOSPITAL_BASED_OUTPATIENT_CLINIC_OR_DEPARTMENT_OTHER): Payer: Self-pay

## 2024-06-02 ENCOUNTER — Encounter (HOSPITAL_BASED_OUTPATIENT_CLINIC_OR_DEPARTMENT_OTHER): Payer: Self-pay | Admitting: Cardiology

## 2024-06-02 ENCOUNTER — Ambulatory Visit (INDEPENDENT_AMBULATORY_CARE_PROVIDER_SITE_OTHER): Admitting: Cardiology

## 2024-06-02 VITALS — BP 122/78 | HR 60 | Ht 69.0 in | Wt 207.4 lb

## 2024-06-02 DIAGNOSIS — Z7189 Other specified counseling: Secondary | ICD-10-CM | POA: Diagnosis not present

## 2024-06-02 DIAGNOSIS — E7800 Pure hypercholesterolemia, unspecified: Secondary | ICD-10-CM

## 2024-06-02 DIAGNOSIS — I10 Essential (primary) hypertension: Secondary | ICD-10-CM | POA: Diagnosis not present

## 2024-06-02 DIAGNOSIS — I351 Nonrheumatic aortic (valve) insufficiency: Secondary | ICD-10-CM | POA: Diagnosis not present

## 2024-06-02 DIAGNOSIS — Z712 Person consulting for explanation of examination or test findings: Secondary | ICD-10-CM

## 2024-06-02 NOTE — Patient Instructions (Signed)
 Medication Instructions:   Your physician recommends that you continue on your current medications as directed. Please refer to the Current Medication list given to you today.   *If you need a refill on your cardiac medications before your next appointment, please call your pharmacy*  Lab Work:  None ordered.  If you have labs (blood work) drawn today and your tests are completely normal, you will receive your results only by: MyChart Message (if you have MyChart) OR A paper copy in the mail If you have any lab test that is abnormal or we need to change your treatment, we will call you to review the results.  Testing/Procedures:  None ordered.  Follow-Up: At [facility], you and your health needs are our priority.  As part of our continuing mission to provide you with exceptional heart care, our providers are all part of one team.  This team includes your primary Cardiologist (physician) and Advanced Practice Providers or APPs (Physician Assistants and Nurse Practitioners) who all work together to provide you with the care you need, when you need it.  Your next appointment:   1 year(s)  Provider:   Sheryle Donning, MD, Slater Duncan, NP, or Neomi Banks, NP    We recommend signing up for the patient portal called "MyChart".  Sign up information is provided on this After Visit Summary.  MyChart is used to connect with patients for Virtual Visits (Telemedicine).  Patients are able to view lab/test results, encounter notes, upcoming appointments, etc.  Non-urgent messages can be sent to your provider as well.   To learn more about what you can do with MyChart, go to [email or web address].   Other Instructions  Your physician wants you to follow-up in: 1 year.  You will receive a reminder letter in the mail two months in advance. If you don't receive a letter, please call our office to schedule the follow-up appointment.

## 2024-06-02 NOTE — Progress Notes (Signed)
 Cardiology Office Note:  .   Date:  06/02/2024  ID:  Greg Dillon, DOB 12/11/1965, MRN [id number] PCP: Seabron Lenis, MD  [facility] Providers Cardiologist:  Shelda Bruckner, MD {  History of Present Illness: .   Greg Dillon is a 58 y.o. male with a hx of hypertension, heart murmur, and hyperlipidemia who is seen for follow up today. I initially met him 05/15/22 as a new consult at the request of Seabron Lenis, MD for the evaluation and management of aortic regurgitation.   No records available from the [facility]. Previously followed by [facility]. cMRI 6/023 with EF 50%, ESD 4.6 cm (EDD 6.0), normal aortic root, moderate AR.  Echo 01/25/2023: EF 50-55%,mild-moderate AR,  EDD 5.14 cm, ESD 4.16 cm. Asc aorta 4.3 cm  Most recent echo 02/27/24: EF 50%, G2DD, mild-moderate AR, aortic dilation 42 mm. ESD 2.90 cm, EDD 5.10 cm.  Today: Admitted at [facility] 04/2024 for abdominal pain, concern for SBO, endoscopy showed gastritis, no evidence of h pylori. Diagnosed with constipation and irritable bowel syndrome, started on treatment and has changed diet as well. Doing much better.  From a cardiac perspective, he has no new concerns. We reviewed his echo results today. No symptoms, reviewed below. Able to use elliptical for 60 minutes, rowing machine for 30-45 minutes, can use punching bag as well. Doesn't do this every day. Has intermittent joint arthritis, sometimes has bad days that limit him, but generally does well.  ROS: Denies chest pain, shortness of breath at rest or with normal exertion. No PND, orthopnea, LE edema or unexpected weight gain. No syncope or palpitations. ROS otherwise negative except as noted.   Studies Reviewed: [facility]ABRA    EKG:  EKG Interpretation Date/Time:  Monday June 02 2024 10:52:30 EDT Ventricular Rate:  60 PR Interval:  142 QRS Duration:  88 QT Interval:  406 QTC Calculation: 406 R Axis:   -46  Text Interpretation: Normal sinus rhythm Left anterior  fascicular block Confirmed by Bruckner Shelda [phone]) on 06/02/2024 11:19:05 AM    Physical Exam:   VS:  BP 122/78   Pulse 60   Ht 5' 9 (1.753 m)   Wt 207 lb 6.4 oz (94.1 kg)   SpO2 96%   BMI 30.63 kg/m    Wt Readings from Last 3 Encounters:  06/02/24 207 lb 6.4 oz (94.1 kg)  12/26/23 222 lb (100.7 kg)  06/21/23 217 lb (98.4 kg)    GEN: Well nourished, well developed in no acute distress HEENT: Normal, moist mucous membranes NECK: No JVD CARDIAC: regular rhythm, normal S1 and S2, no rubs or gallops. No murmur. VASCULAR: Radial and DP pulses 2+ bilaterally. No carotid bruits RESPIRATORY:  Clear to auscultation without rales, wheezing or rhonchi  ABDOMEN: Soft, non-tender, non-distended MUSCULOSKELETAL:  Ambulates independently SKIN: Warm and dry, no edema NEUROLOGIC:  Alert and oriented x 3. No focal neuro deficits noted. PSYCHIATRIC:  Normal affect    ASSESSMENT AND PLAN: .    Mild-moderate aortic regurgitation -per [facility] notes, initially diagnosed ~2018 -asymptomatic -last MRI 02/2022. Would repeat MRI if symptoms or echo worsens. -echo 02/2024 stable, see above, we discussed results today -repeat one year, already ordered   Hypercholesterolemia -on atorvastatin 40 mg daily -no known CAD -on aspirin  per patient preference -pending calcium score at the [facility] -follows with Dr. Seabron and the [facility] for lipid management -most recent labs I show from [facility] 04/08/24: Tchol 134, HDL 35, LDL 80, TG 70   Hypertension -continue amlodipine  CV  risk counseling and prevention -recommend heart healthy/Mediterranean diet, with whole grains, fruits, vegetable, fish, lean meats, nuts, and olive oil. Limit salt. -recommend moderate walking, 3-5 times/week for 30-50 minutes each session. Aim for at least 150 minutes/week. Goal should be pace of 3 miles/hours, or walking 1.5 miles in 30 minutes -recommend avoidance of tobacco products. Avoid excess alcohol.  Dispo: 1 year or sooner as  needed  Signed, Shelda Bruckner, MD   Shelda Bruckner, MD, PhD, [facility]  [facility]  [place]  [facility] at [facility] [address] [place], [place] [address] [phone]

## 2024-06-17 ENCOUNTER — Ambulatory Visit (INDEPENDENT_AMBULATORY_CARE_PROVIDER_SITE_OTHER): Admitting: Physician Assistant

## 2024-06-17 ENCOUNTER — Encounter: Payer: Self-pay | Admitting: Physician Assistant

## 2024-06-17 VITALS — BP 136/86 | HR 81 | Resp 20 | Ht 69.0 in | Wt 205.0 lb

## 2024-06-17 DIAGNOSIS — R519 Headache, unspecified: Secondary | ICD-10-CM

## 2024-06-17 DIAGNOSIS — G8929 Other chronic pain: Secondary | ICD-10-CM | POA: Diagnosis not present

## 2024-06-17 NOTE — Progress Notes (Signed)
 [facility] Clinic Note - Initial Visit   Greg Dillon is a 58 y.o. male presenting today in follow up for evaluation of headaches.  The patient is here alone.  Previous records as well as any outside records available were reviewed prior to today's office visit     Frequency?  Better but not going away. In September I had 3 and in October had 2, came in blocks  May be triggered by pain.  They are better in frequency and quality.  Severity: 6-7/10 Quality: knife like, sharp,pounding sometimes  Photophobia?  Yes  Phonophobia?  Yes   Nausea?  Yes  Aura?  Yes occasionally I see flashes of light -not as prominent now.  Aggravating factors?  He has sinusitis, takes Flonase-sneezing may aggravate the headache.  Stress, may trigger an episode. Confusion? No   Working? No  Heavy lifting?  Working out is relaxing to him.  However the torn rotator calf limits the workout.   Dizziness?  No  Sleeps well?  Sleeping pattern has improved, not as much insomnia as I had in the past. Takes some naps during the day.   Caffeine?:  1/2 to 1 cup of coffee   Water?: yes, I have taken about 70 oz a day  Exercise: Yes, walks frequently and does PT and massage therapy for the whole body. Depression:  Denies   Sleep?  Sleeps better without sleep aids Relieving factors?  Lies down, puts a towel on the forehead, tries to sleep.  He also enjoys working outdoors helping at [employer], [employer] it is relaxing for him. Uses transition glasses which are helpful.  Alcohol? No longer taking ETOH.  Tobacco?  Every 4 months he may have a cigar.  Current NSAIDS:  none due to GI issues Current analgesics:  Tylenol  as needed although he is concerned about liver complications with taking it    Current triptans: Sumatriptan  50 mg at the first onset of headache, maximum 4 a day  Current prophylactic medications?  None. He was on propanolol but his psych MD recommended not to take  it anymore as it interferes with some of the meds.  Current ergotamine:  none  Current anti-emetic:  Zofran  4mg .     Current muscle relaxants:   cyclobenzaprine as needed  Current  mood medicine:  Zoloft  Current sleep aide:  none  Current Antihypertensive medications:  amlodipine Current Anticonvulsant medications: none   Current anti-CGRP:  none  Current Vitamins/Herbal/Supplements: no   Current Antihistamines/Decongestants:  Flonase, Singulair Other therapy:  none  Hormone/birth control:  none  Other medications: none .      Initial visit  Onset: started at age 67-18, in College had some episodes and but were never treated with medicines.  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 Since then, when internalizing issues, his HA is worse.  Sometimes, it interferes with comprehension.  Of note, in the recent past the patient had been evaluated for memory issues, yielding completely normal evaluation and imaging. His memory is better since he retired and has less stressed, he is making an effort to organize the thoughts. He is learning routines and tries to learn the new normal Quality: usually bet ween 8/10, but today he had an episode of 10/10 Intensity: Sharp, like a knife or constant ache, pounding sometimes.   Location: bandlike in the frontal area and a burning sensation stings in the back  of the head, sometimes extending B sides of the head and the face . And at times he feels the pain behind the eyeballs  Duration:  Usually 1 h  Frequency:   15 times a month .they come in blocks  One in March 30, early April ( lasting 5 days) , Mid April ( 2 days) and Today, now subsiding (reporting that it may have been triggered by arthritis shoulder pain).   Associated symptoms: sometimes nausea, no vomiting.  Aura: Endorsed.  Occasionally seen flashes of light   Activity: In the winter, I am not as active, so maybe if I am in  the computer too long may trigger. TV does not bother me.   Aggravating factors: Has sinusitis, takes Flonase, so the sneezing may aggravate the headache.  There is some stress, death in the family so he is dealing with all the legal matters. Going to hotels really stresses him and it may trigger an episode. .  Relieving factors: lies down, puts a towel on the forehead, trying to sleep, light and sounds may make it worse. Working out is relaxing to him I am helping at [employer] in [employer] for services. I like working on the yard but joint pain has resulted in decreased activity.    Current abortive medications: Sumatriptan   50 mg at the first onset of headache, max 4 in a day, but has not bee helping Tylenol  as needed because he is concerned about liver complications Current prophylactic medications: None   Past abortive medications:  BC powder 2-3 a week , Excedrin, Goody Powder   Past prophylactic medications:    Family history: Negative for migraines. Smoker: cigars  Once every 4 months  Alcohol: not recently. Has done therapy and medications and this has resolved Caffeine: 16  oz  a day of coffee  A couple of times a week may have 2-3 cups a day.   Sleep:   Sleeps better on trazodone   MRI brain 11/2021 Unremarkable MRI appearance of the brain. No evidence of acute intracranial abnormality.  Past Medical History:  Diagnosis Date   Heart murmur    Herpes    High cholesterol    Hypertension    Allergies  Allergen Reactions   Azithromycin Hives   Losartan Other (See Comments)    Affected kidney functionn  Renal failure   Povidone Iodine Other (See Comments) and Swelling    Blisters on toes  Other Reaction(s): Not available   Tessalon [Benzonatate]     Past Surgical History:  Procedure Laterality Date   EYE SURGERY Right    LUMBAR EPIDURAL INJECTION     Social History   Social History Narrative   Right handed   Caffeine yes   Two story home   Retired Electronics engineer  for 22 years    Family History  Problem Relation Age of Onset   Hypertension Mother    Hyperlipidemia Father    Diabetes Mellitus I Father    Hypertension Sister    Seizures Brother      Scheduled Meds:         General Medical Exam:   General:  Well appearing, comfortable.  NAD. Eyes/ENT: PERRLA, EOMI fundi not visualized Neck:   No carotid bruits.  No paraspinal tenderness, full ROM Respiratory:  Clear to auscultation, good air entry bilaterally.   Cardiac:  Regular rate and rhythm, no murmur.   Extremities:  No deformities, edema, or skin discoloration.  Skin:  No rashes or lesions.  Neurological Exam: MENTAL STATUS including orientation to time, place, person, recent and remote memory, attention span and concentration, language, and fund of knowledge is normal.  Speech is not dysarthric.  CRANIAL NERVES: II:  No visual field defects.  Unremarkable fundi.  Wears transition glasses III-IV-VI: Pupils equal round and reactive to light.  Normal conjugate, extra-ocular eye movements in all directions of gaze.  No nystagmus.  No ptosis .   V:  Normal facial sensation.    VII:  Normal facial symmetry and movements.   VIII:  Normal hearing and vestibular function.   IX-X:  Normal palatal movement.   XI:  Normal shoulder shrug and head rotation.   XII:  Normal tongue strength and range of motion, no deviation or fasciculation.  MOTOR:  No atrophy, fasciculations or abnormal movements.  No pronator drift.    SENSORY:  Normal and symmetric perception of light touch, pinprick, vibration, and proprioception.  Romberg's sign absent.   COORDINATION/GAIT: Normal finger-to- nose-finger and heel-to-shin.  Intact rapid alternating movements bilaterally.  Able to rise from a chair without using arms.  Gait narrow based and stable. Tandem and stressed gait intact.   Assessment/Plan  Greg Dillon is a 58 y.o. male presenting for evaluation of headaches.      For abortive therapy,  continue sumatriptan  50 mg at the first onset of headaches, can take another dose 2 hours as needed, maximum 2 a day as per PCP.  May consider increasing to 100 if not therapeutic   Limit use of pain relievers to no more than 2 days out of week to prevent risk of rebound or medication-overuse headache.  Keep headache diary  Exercise, hydration, caffeine cessation, sleep hygiene, monitor for and avoid triggers  Consider:  magnesium citrate 400mg  daily, riboflavin 400mg  daily, and coenzyme Q10 100mg  three times daily Always keep in mind that currently taking a hormone or birth control may be a possible trigger or aggravating factor for migraine. Follow up in 6  months     Total time spent:  25   Thank you for allowing me to participate in patient's care.  If I can answer any additional questions, I would be pleased to do so.    Sincerely,   Camie Sevin, PA-C

## 2024-06-17 NOTE — Patient Instructions (Signed)
  Take Sumatriptan  50 mg  at earliest onset of headache.  May repeat dose once in 2 hours if needed.  Maximum 2 tablets in 24 hours Limit use of pain relievers to no more than 2 days out of the week.  These medications include acetaminophen , NSAIDs (ibuprofen /Advil /Motrin , naproxen/Aleve, triptans (Imitrex /sumatriptan ), Excedrin, and narcotics.  This will help reduce risk of rebound headaches. Consider supplements:  magnesium citrate 400mg  daily, riboflavin 400mg  daily, coenzyme Q10 100mg  three times daily. Be aware of common food triggers:  - Caffeine:  coffee, black tea, cola, Mt. Dew  - Chocolate  - Dairy:  aged cheeses (brie, blue, cheddar, gouda, Parmasan, provolone, romano, Swiss, etc), chocolate milk, buttermilk, sour cream, limit eggs and yogurt  - Nuts, peanut butter  - Alcohol  - Cereals/grains:  FRESH breads (fresh bagels, sourdough, doughnuts), yeast productions  - Processed/canned/aged/cured meats (pre-packaged deli meats, hotdogs)  - MSG/glutamate:  soy sauce, flavor enhancer, pickled/preserved/marinated foods  - Sweeteners:  aspartame (Equal, Nutrasweet).  Sugar and Splenda are okay  - Vegetables:  legumes (lima beans, lentils, snow peas, fava beans, pinto peans, peas, garbanzo beans), sauerkraut, onions, olives, pickles  - Fruit:  avocados, bananas, citrus fruit (orange, lemon, grapefruit), mango  - Other:  Frozen meals, macaroni and cheese Routine exercise Stay adequately hydrated (aim for 64 oz water daily) Keep headache diary Maintain proper stress management Maintain proper sleep hygiene Do not skip meals

## 2024-06-20 ENCOUNTER — Encounter (HOSPITAL_BASED_OUTPATIENT_CLINIC_OR_DEPARTMENT_OTHER): Payer: Self-pay

## 2024-08-05 DIAGNOSIS — E782 Mixed hyperlipidemia: Secondary | ICD-10-CM | POA: Diagnosis not present

## 2024-08-05 DIAGNOSIS — E1169 Type 2 diabetes mellitus with other specified complication: Secondary | ICD-10-CM | POA: Diagnosis not present

## 2024-08-05 DIAGNOSIS — F4310 Post-traumatic stress disorder, unspecified: Secondary | ICD-10-CM | POA: Diagnosis not present

## 2024-08-05 DIAGNOSIS — Z23 Encounter for immunization: Secondary | ICD-10-CM | POA: Diagnosis not present

## 2024-08-05 DIAGNOSIS — I10 Essential (primary) hypertension: Secondary | ICD-10-CM | POA: Diagnosis not present

## 2024-09-20 ENCOUNTER — Encounter: Payer: Self-pay | Admitting: Physician Assistant

## 2024-09-22 ENCOUNTER — Other Ambulatory Visit: Payer: Self-pay | Admitting: Physician Assistant

## 2024-09-22 MED ORDER — SUMATRIPTAN SUCCINATE 50 MG PO TABS: ORAL_TABLET | ORAL | 10 refills | Status: AC

## 2024-12-16 ENCOUNTER — Ambulatory Visit: Admitting: Physician Assistant
